# Patient Record
Sex: Female | Born: 1980
Health system: Southern US, Community
[De-identification: ages and names within clinical notes are randomized; demographics above are authoritative.]

## PROBLEM LIST (undated history)

## (undated) DIAGNOSIS — T7840XA Allergy, unspecified, initial encounter: Secondary | ICD-10-CM

## (undated) DIAGNOSIS — I7300 Raynaud's syndrome without gangrene: Secondary | ICD-10-CM

## (undated) DIAGNOSIS — U071 COVID-19: Secondary | ICD-10-CM

## (undated) DIAGNOSIS — Z9289 Personal history of other medical treatment: Secondary | ICD-10-CM

## (undated) DIAGNOSIS — Z8759 Personal history of other complications of pregnancy, childbirth and the puerperium: Secondary | ICD-10-CM

## (undated) DIAGNOSIS — K219 Gastro-esophageal reflux disease without esophagitis: Secondary | ICD-10-CM

## (undated) DIAGNOSIS — Z98891 History of uterine scar from previous surgery: Secondary | ICD-10-CM

## (undated) HISTORY — DX: Raynaud's syndrome without gangrene: I73.00

## (undated) HISTORY — PX: COSMETIC SURGERY: SHX468

## (undated) HISTORY — DX: COVID-19: U07.1

## (undated) HISTORY — DX: Allergy, unspecified, initial encounter: T78.40XA

---
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
## 2010-07-16 ENCOUNTER — Inpatient Hospital Stay (HOSPITAL_COMMUNITY)
Admission: AD | Admit: 2010-07-16 | Discharge: 2010-07-16 | Payer: Self-pay | Source: Home / Self Care | Admitting: Obstetrics

## 2010-07-23 ENCOUNTER — Ambulatory Visit (HOSPITAL_COMMUNITY)
Admission: RE | Admit: 2010-07-23 | Discharge: 2010-07-23 | Payer: Self-pay | Source: Home / Self Care | Admitting: Obstetrics

## 2010-08-24 DIAGNOSIS — Z9289 Personal history of other medical treatment: Secondary | ICD-10-CM

## 2010-08-24 HISTORY — DX: Personal history of other medical treatment: Z92.89

## 2010-09-15 ENCOUNTER — Observation Stay (HOSPITAL_COMMUNITY)
Admission: AD | Admit: 2010-09-15 | Discharge: 2010-09-16 | Payer: Self-pay | Source: Home / Self Care | Attending: Obstetrics & Gynecology | Admitting: Obstetrics & Gynecology

## 2010-09-16 LAB — CBC
Hemoglobin: 10.5 g/dL — ABNORMAL LOW (ref 12.0–15.0)
MCH: 32.2 pg (ref 26.0–34.0)
MCHC: 34.1 g/dL (ref 30.0–36.0)
RDW: 13.4 % (ref 11.5–15.5)

## 2010-09-16 LAB — TYPE AND SCREEN
ABO/RH(D): A POS
Antibody Screen: NEGATIVE

## 2010-09-17 LAB — GLUCOSE TOLERANCE, 1 HOUR: Glucose, 1 Hour GTT: 147 mg/dL — ABNORMAL HIGH (ref 70–140)

## 2010-09-19 NOTE — H&P (Signed)
  NAME:  Kendra Molina, Kendra Molina                ACCOUNT NO.:  [id number]  MEDICAL RECORD NO.:  [id number]          PATIENT TYPE:  OBV  LOCATION:  9156                          FACILITY:  [facility]  PHYSICIAN:  Lenoard Aden, M.D.DATE OF BIRTH:  1980/10/13  DATE OF ADMISSION:  09/15/2010 DATE OF DISCHARGE:                             HISTORY & PHYSICAL   CHIEF COMPLAINTS:  Pelvic pressure.  She is a 29-year [place] female G1, P0 with uncomplicated pregnancy care to date, who presented to the office today with abdominal discomfort, cramps, and lower abdominal pressure.  She had a speculum exam which revealed the cervix to be long, closed, and no digital exam due to history of placenta previa.  She had a fetal fibronectin performed that was positive and a previous GBS positive in her urine, for which she was started on Keflex and admitted at this time for continuous monitoring, betamethasone, NICU consultation, and rule out preterm labor.  She is at 38 and 4 days' gestation.  She has no known drug allergies.  No latex allergies.  MEDICATIONS:  Prenatal vitamins, calcium, iron, and Keflex 500 mg b.i.d. started today.  She is a nonsmoker, nondrinker.  She denies domestic or physical violence.  She has a family history of kidney stones, hypertension, rheumatoid arthritis, Down syndrome, mental retardation, heart disease, COPD, and diabetes.  She has a personal history that is otherwise noncontributory minus wisdom tooth extraction and removal of a birthmark on her right cheek.  PHYSICAL EXAMINATION:  VITAL SIGNS:  She has a height of 62 inches, a weight of 142.  Blood pressure 110/78. HEENT:  Normal. NECK:  Supple.  Full range of motion. LUNGS:  Clear. HEART:  Regular rhythm. ABDOMEN:  Soft, gravid, nontender.  No CVA tenderness. EXTREMITIES:  There were no cords. NEUROLOGIC:  Nonfocal. SKIN:  Intact. PELVIC:  Deferred due to previous history of placenta previa.  Fetal heart rate tracing  is reassuring with accelerations noted and no regular contractions noted.  IMPRESSION: 1. A 27-4/7th weeks' gestation. 2. Preterm contractions like symptoms.  No evidence of cervical change     with a positive FFN. 3. History of anterior partial placenta previa. 4. History of bilateral choroid plexus cysts with no other risk     factors for aneuploidy.  PLAN:  To be admitted for continuous monitoring at this point, continue her Keflex 500 mg p.o. b.i.d., administer betamethasone 12.5 mg IM, repeat in 24 hours.  We will obtain an ultrasound to follow up on her placenta previa in addition to her choroid plexus cysts.     Lenoard Aden, M.D.     RJT/MEDQ  D:  09/15/2010  T:  09/16/2010  Job:  [id number]  Electronically Signed by Olivia Mackie M.D. on 09/19/2010 02:35:35 PM

## 2010-10-17 NOTE — Discharge Summary (Signed)
  NAME:  Kendra Molina, Kendra Molina                ACCOUNT NO.:  [id number]  MEDICAL RECORD NO.:  [id number]          PATIENT TYPE:  LOCATION:                                 FACILITY:  PHYSICIAN:  Lenoard Aden, M.D.     DATE OF BIRTH:  DATE OF ADMISSION:  09/15/2010 DATE OF DISCHARGE:  09/16/2010                              DISCHARGE SUMMARY   ADMISSION DIAGNOSIS:  Preterm labor with positive fetal fibronectin.  DISCHARGE DIAGNOSIS:  Preterm labor with positive fetal fibronectin.  The patient was admitted due to a positive fetal fibronectin was obtained in the office on the day of admission.  Her hospital course was uncomplicated.  She had reassuring continuous monitoring.  She received betamethasone series, Keflex for her GBS bacteria.  She had a maternal fetal ultrasound which revealed a normal estimated fetal weight and an otherwise normal scan within a normal cervical length.  She was discharged to home on hospital day #2 after receiving her second of her betamethasone series.  She is scheduled for followup in the office in 1 week.  Discharge teaching was done.  Discharge medications are Keflex and prenatal vitamins.  The patient as noted to follow up in the office 1 week.     Lenoard Aden, M.D.     RJT/MEDQ  D:  09/30/2010  T:  10/01/2010  Job:  [id number]  Electronically Signed by Olivia Mackie M.D. on 10/17/2010 11:05:28 AM

## 2010-11-03 ENCOUNTER — Other Ambulatory Visit: Payer: Self-pay | Admitting: Obstetrics and Gynecology

## 2010-11-03 ENCOUNTER — Inpatient Hospital Stay (HOSPITAL_COMMUNITY)
Admission: AD | Admit: 2010-11-03 | Discharge: 2010-11-06 | DRG: 650 | Disposition: A | Discharge status: Home / Self Care | Payer: Federal, State, Local not specified - PPO | Source: Ambulatory Visit | Attending: Obstetrics and Gynecology | Admitting: Obstetrics and Gynecology

## 2010-11-03 DIAGNOSIS — O9903 Anemia complicating the puerperium: Secondary | ICD-10-CM | POA: Diagnosis not present

## 2010-11-03 DIAGNOSIS — Z22330 Carrier of Group B streptococcus: Secondary | ICD-10-CM

## 2010-11-03 DIAGNOSIS — O4410 Placenta previa with hemorrhage, unspecified trimester: Principal | ICD-10-CM | POA: Diagnosis present

## 2010-11-03 DIAGNOSIS — O99892 Other specified diseases and conditions complicating childbirth: Secondary | ICD-10-CM | POA: Diagnosis present

## 2010-11-03 DIAGNOSIS — D62 Acute posthemorrhagic anemia: Secondary | ICD-10-CM | POA: Diagnosis not present

## 2010-11-03 LAB — APTT: aPTT: 26 seconds (ref 24–37)

## 2010-11-03 LAB — CBC
HCT: 32.1 % — ABNORMAL LOW (ref 36.0–46.0)
Hemoglobin: 10.9 g/dL — ABNORMAL LOW (ref 12.0–15.0)
MCHC: 34 g/dL (ref 30.0–36.0)
MCV: 95.8 fL (ref 78.0–100.0)
RDW: 13.1 % (ref 11.5–15.5)
WBC: 14.4 10*3/uL — ABNORMAL HIGH (ref 4.0–10.5)

## 2010-11-03 LAB — COMPREHENSIVE METABOLIC PANEL
ALT: 20 U/L (ref 0–35)
AST: 28 U/L (ref 0–37)
Albumin: 2.9 g/dL — ABNORMAL LOW (ref 3.5–5.2)
Alkaline Phosphatase: 112 U/L (ref 39–117)
Chloride: 105 mEq/L (ref 96–112)
Potassium: 3.6 mEq/L (ref 3.5–5.1)
Sodium: 135 mEq/L (ref 135–145)
Total Bilirubin: 0.5 mg/dL (ref 0.3–1.2)
Total Protein: 6 g/dL (ref 6.0–8.3)

## 2010-11-03 LAB — PROTIME-INR: INR: 1.01 (ref 0.00–1.49)

## 2010-11-04 LAB — CBC
Hemoglobin: 7 g/dL — ABNORMAL LOW (ref 12.0–15.0)
MCH: 33 pg (ref 26.0–34.0)
MCHC: 34.3 g/dL (ref 30.0–36.0)
RDW: 13.1 % (ref 11.5–15.5)

## 2010-11-05 LAB — CBC
MCV: 98.1 fL (ref 78.0–100.0)
Platelets: 113 10*3/uL — ABNORMAL LOW (ref 150–400)
RDW: 13.6 % (ref 11.5–15.5)
WBC: 14.1 10*3/uL — ABNORMAL HIGH (ref 4.0–10.5)

## 2010-11-05 LAB — PREPARE RBC (CROSSMATCH)

## 2010-11-06 LAB — TYPE AND SCREEN
Unit division: 0
Unit division: 0

## 2010-11-06 LAB — CBC
MCH: 31.7 pg (ref 26.0–34.0)
MCHC: 34.1 g/dL (ref 30.0–36.0)
Platelets: 122 10*3/uL — ABNORMAL LOW (ref 150–400)
RBC: 3.09 MIL/uL — ABNORMAL LOW (ref 3.87–5.11)

## 2010-11-13 NOTE — Op Note (Signed)
NAME:  Kendra Molina, Kendra Molina                ACCOUNT NO.:  [id number]  MEDICAL RECORD NO.:  [id number]           PATIENT TYPE:  I  LOCATION:  9317                          [facility]ILITY:  [facility]  PHYSICIAN:  Lenoard Aden, M.D.DATE OF BIRTH:  Sep 20, 1980  DATE OF PROCEDURE:  11/03/2010 DATE OF DISCHARGE:                              OPERATIVE REPORT   PREOPERATIVE DIAGNOSES: 1. A 34-1/2 week intrauterine pregnancy. 2. Complete placenta previa with active bleeding.  POSTOPERATIVE DIAGNOSES: 1. A 34-1/2 week intrauterine pregnancy. 2. Complete placenta previa with active bleeding.  PROCEDURE:  Urgent primary low segment transverse cesarean section.  SURGEON:  Lenoard Aden, MD  ASSISTANT:  None.  ANESTHESIA:  Spinal by Quillian Quince, MD  ESTIMATED BLOOD LOSS:  1000 mL.  COMPLICATIONS:  None.  DRAINS:  Foley.  COUNTS:  Correct.  The patient to recovery in good condition.  FINDINGS:  A 5 pounds 7 ounces female, Apgars 8 and 9, pediatricians attendance.  Normal tubes, normal ovaries.  Bleeding from the left lateral edge of the incision controlled with an Publishing copy. Clear urine.  The patient to recovery in good condition.  All counts correct.  BRIEF OPERATIVE NOTE:  After being apprised of the risks of anesthesia, infection, bleeding, injury to the abdominal organs, need for repair, delayed versus immediate complications to include bowel and bladder injury, possible need for repair, the patient was brought to the operating where she was administered a spinal anesthetic without complications, prepped and draped in usual sterile fashion.  Foley catheter was placed.  After achieving adequate anesthesia, dilute Marcaine solution was placed.  A Pfannenstiel skin incision was made with scalpel, carried down to the fascia which was nicked in the midline and opened transversely using Mayo scissors.  Rectus muscles were dissected sharply in the midline, peritoneum entered  sharply.  Bladder blade placed.  Visceral peritoneum scored sharply off the lower uterine segment.  Kerr hysterotomy incision was made.  Atraumatic delivery after encountering a complete anterior placenta previa and then amniotomy of clear fluid for delivery of a female from an occiput transverse position with a loose nuchal cord x2, handed to the pediatrician in attendance. Apgars as noted.  Cord blood collected.  Placenta delivered manually intact.  Uterus exteriorized, curetted using dry lap pack and visualization reveals good hemostasis in the lower uterine segment.  In addition, a well contracted uterus which was then closed in two running imbricating layers of 0 Monocryl suture.  There was extension into the right uterine artery which was controlled using an Catering manager. After replacement of the uterus and the abdominal cavity and irrigation accomplished, further oozing was noted from this right lateral edge, an additional O'Leary stitch was placed.  After re-exteriorizing the uterus and placement, good hemostasis was assured and inspection revealed good hemostasis at this right lateral edge.  At this time, bladder flap was inspected and found to be hemostatic.  Good hemostasis was noted.  Urine output was excellent and urine is clear.  The parietal peritoneum was then closed using a 2-0 Vicryl suture in a pursestring fashion, the fascia closed using  0 Monocryl in a running fashion, skin closed using staples.  The patient tolerated this procedure well and was transferred to recovery in good condition.     Lenoard Aden, M.D.     RJT/MEDQ  D:  11/03/2010  T:  11/04/2010  Job:  [id number]  Electronically Signed by Olivia Mackie M.D. on 11/13/2010 01:33:22 PM

## 2010-11-13 NOTE — Discharge Summary (Signed)
NAME:  Kendra Molina, Kendra Molina                ACCOUNT NO.:  [id number]  MEDICAL RECORD NO.:  [id number]           PATIENT TYPE:  I  LOCATION:  9317                          [facility]ILITY:  [facility]  PHYSICIAN:  Lenoard Aden, M.D.DATE OF BIRTH:  [birth year]  DATE OF ADMISSION:  11/03/2010 DATE OF DISCHARGE:  11/06/2010                              DISCHARGE SUMMARY   ADMITTING DIAGNOSIS:  Intrauterine pregnancy at 34 weeks, placenta previa with active vaginal bleeding  DISCHARGE DIAGNOSIS:  Postop day #3 status post urgent primary C-section  for placenta previa with active bleeding, ABL anemia status post blood transfusion.  HISTORY:  The patient is gravida 1, para 0 at 32 weeks' and 5 days' gestation with [facility] December 11, 2010.  Prenatal care was obtained at [facility] since 8 weeks and 4 days' gestation with Dr. Ernestina Penna as primary.  PRENATAL LABS:  A+, rubella equivocal, GBS positive, HIV negative, RPR negative, hepatitis B negative, and 1-hour GTT within normal limits.  PRENATAL COURSE: Prenatal course complicated by placenta previa, anemia, and  threatened preterm labor at 27 wks with positive fetal fibronectin, at which  time patient was admited to antepartum unit for 24 hours monitoring and  betamethasone prophylaxis.    SURGICAL HISTORY:  The patient is healthy and has a known history of birthmark removal and wisdom teeth removal.  ALLERGIES:  No known drug allergies.  CURRENT MEDICATIONS:  Prenatal vitamins, iron, folic acid, Colace, vitamin D, and calcium.  PATIENT PRESENTATION:  The patient presents to [facility]U on the evening of November 03, 2010, with active pain with painless bleeding, reassuring fetal heart rate, and no contractions.  Upon speculum exam, clot was noted in vagina and active bleeding noted.  Admission labs were WBCs 14.4, hemoglobin 10.9, hematocrit 32.1, platelets 140.  PT 13.5, PTT 26, INR 1.01.  The patient was prepped for urgent C-section delivery.    SURGERY: The patient  was delivered via primary C-section on November 03, 2010, by Dr. Billy Coast. Indication was 34 weeks' intrauterine pregnancy with  active bleed and known placenta previa.  The patient was delivered of a female  infant with a weight of 5 pounds and 14 ounces and Apgars 9 and 9.  Newborn was transferred to the NICU.  POSTOP COURSE:  Postop course was complicated by severe ABL anemia and the patient underwent transfusion on postoperative day 2.  Postop labs were WBC 17.8, hemoglobin 7.0, hematocrit 20.4, and platelets 108. Repeat CBC on day 2 showed a drop in hemoglobin 6.7 and hematocrit 20.3. Subsequently the patient was transfused 3 PRBCs with subsequent increase in hemoglobin and hematocrit to 9.8 and 28.7, and platelets of 122 on postop  day #3. Vital signs remained stable.  The patient was afebrile throughout hospital stay and physical exam was within normal limits.  Wound was well approximated with staples.  No erythema, no drainage, and no ecchymosis at the staple site.  The patient is pumping breast milk and infant is undergoing feeds currently in NICU.  The patient was discharged home on postop day #3 in stable condition. Staples removed prior to discharge and replaced with Steri-Strips.  DIET:  Regular diet.  ACTIVITY:  Ad lib with postop weight lifting restrictions x2 weeks.  INSTRUCTIONS:  Per [facility] booklet.  MEDICATIONS: 1. MMR vaccine one dose prior to discharge. 2. Prenatal vitamins 1 tablet p.o. daily. 3. Colace 100 mg p.o. daily. 4. Ibuprofen 800 mg q.8 h. p.r.n. for pain. 5. Tylox 1-2 tablets p.o. q.4-6 h. p.r.n. for pain. 6. Nu-Iron 150 one tablet p.o. b.i.d.  Followup is in 6 weeks postpartum at [facility].    ______________________________ Arlan Organ, CNM   ______________________________ Lenoard Aden, M.D.    DP/MEDQ  D:  11/06/2010  T:  11/07/2010  Job:  [id number]  Electronically Signed by Arlan Organ CNM on 11/07/2010 06:42:43 AM Electronically Signed  by Olivia Mackie M.D. on 11/13/2010 01:33:29 PM

## 2010-12-03 ENCOUNTER — Ambulatory Visit (HOSPITAL_COMMUNITY)
Admission: RE | Admit: 2010-12-03 | Discharge: 2010-12-03 | Disposition: A | Discharge status: Home / Self Care | Payer: Federal, State, Local not specified - PPO | Source: Ambulatory Visit | Attending: Obstetrics | Admitting: Obstetrics

## 2010-12-17 ENCOUNTER — Other Ambulatory Visit: Payer: Self-pay | Admitting: Obstetrics

## 2011-03-11 ENCOUNTER — Encounter: Payer: Self-pay | Admitting: *Deleted

## 2012-12-06 ENCOUNTER — Other Ambulatory Visit (HOSPITAL_COMMUNITY): Payer: Self-pay | Admitting: Obstetrics

## 2012-12-06 DIAGNOSIS — N979 Female infertility, unspecified: Secondary | ICD-10-CM

## 2012-12-09 ENCOUNTER — Ambulatory Visit (HOSPITAL_COMMUNITY)
Admission: RE | Admit: 2012-12-09 | Discharge: 2012-12-09 | Disposition: A | Discharge status: Home / Self Care | Payer: Federal, State, Local not specified - PPO | Source: Ambulatory Visit | Attending: Obstetrics | Admitting: Obstetrics

## 2012-12-09 DIAGNOSIS — N979 Female infertility, unspecified: Secondary | ICD-10-CM

## 2012-12-09 MED ORDER — IOHEXOL 300 MG/ML  SOLN
20.0000 mL | Freq: Once | INTRAMUSCULAR | Status: AC | PRN
  Administered 2012-12-09: 3 mL

## 2013-05-04 LAB — OB RESULTS CONSOLE HIV ANTIBODY (ROUTINE TESTING): HIV: NONREACTIVE

## 2013-05-04 LAB — OB RESULTS CONSOLE ANTIBODY SCREEN: ANTIBODY SCREEN: NEGATIVE

## 2013-05-04 LAB — OB RESULTS CONSOLE HEPATITIS B SURFACE ANTIGEN: Hepatitis B Surface Ag: NEGATIVE

## 2013-05-04 LAB — OB RESULTS CONSOLE RUBELLA ANTIBODY, IGM: Rubella: IMMUNE

## 2013-05-04 LAB — OB RESULTS CONSOLE ABO/RH: "RH Type ": POSITIVE

## 2013-09-15 ENCOUNTER — Ambulatory Visit (INDEPENDENT_AMBULATORY_CARE_PROVIDER_SITE_OTHER): Payer: Federal, State, Local not specified - PPO | Admitting: Medical

## 2013-09-15 ENCOUNTER — Encounter: Payer: Self-pay | Admitting: Medical

## 2013-09-15 VITALS — BP 90/60 | HR 82 | Temp 98.2°F | Resp 18 | Wt 139.0 lb

## 2013-09-15 DIAGNOSIS — Z331 Pregnant state, incidental: Secondary | ICD-10-CM

## 2013-09-15 DIAGNOSIS — R040 Epistaxis: Secondary | ICD-10-CM

## 2013-09-15 DIAGNOSIS — J029 Acute pharyngitis, unspecified: Secondary | ICD-10-CM

## 2013-09-15 DIAGNOSIS — R0982 Postnasal drip: Secondary | ICD-10-CM

## 2013-09-15 LAB — POCT RAPID STREP A (OFFICE): RAPID STREP A SCREEN: NEGATIVE

## 2013-09-15 NOTE — Patient Instructions (Signed)
We will send culture of the throat.   Try changing to benadryl at night instead of Claritin for a week or so.  Use nasal saline flush followed by salt water gargles  Use warm fluids like decaf coffee or tea  You can use Tylenol for pain  Try using Vaseline coating in each nostril at bedtime  Drink plenty of water  Try lying more inclined in the bed until this resolves.

## 2013-09-15 NOTE — Progress Notes (Signed)
Subjective:  Kendra Molina is a 33 y.o. female who presents as a new patient today for evaluation of sore throat. She is [redacted] weeks pregnant, sees Dr. Ernestina PennaFogleman for gynecology/obstetrics.  She notes about 2 weeks history of sore throat, some ear discomfort on the left, congestion, postnasal drainage, some cough. Denies fever, nausea, vomiting, diarrhea, rash, no sinus pressure, teeth pain, no shortness of breath. No sick contacts with similar, no strep or mono exposures.  Treatment to date: Claritin.  They have gas heat at home and she has experienced nosebleeds. No other aggravating or relieving factors.  No other c/o.  The following portions of the patient's history were reviewed and updated as appropriate: allergies, current medications, past medical history, past social history, past surgical history and problem list.  ROS as in subjective   Objective: Filed Vitals:   09/15/13 1505  BP: 90/60  Pulse: 82  Temp: 98.2 F (36.8 C)  Resp: 18    General appearance: no distress, WD/WN HEENT: normocephalic, conjunctiva/corneas normal, sclerae anicteric, nares patent, clear and dry blood discharge , pharynx with mild erythema, no exudate.  Oral cavity: MMM, no lesions  Neck: supple, no lymphadenopathy, no thyromegaly Heart: RRR, normal S1, S2, no murmurs Lungs: CTA bilaterally, no wheezes, rhonchi, or rales   Laboratory Strep test done. Results:negative.    Assessment and Plan: Encounter Diagnoses  Name Primary?  . Sore throat Yes  . Post-nasal drip   . Epistaxis   . Pregnant state, incidental    Advised that her sore throat symptoms seem related to postnasal drainage and not an infectious etiology.  Strep negative, but we'll send a throat culture just to make sure.  Reassured, advise that her symptoms should resolve with some of the following recommendations  Patient Instructions  We will send culture of the throat.   Try changing to benadryl at night instead of Claritin for a  week or so.  Use nasal saline flush followed by salt water gargles  Use warm fluids like decaf coffee or tea  You can use Tylenol for pain  Try using Vaseline coating in each nostril at bedtime  Drink plenty of water  Try lying more inclined in the bed until this resolves.

## 2013-09-17 LAB — CULTURE, GROUP A STREP: Organism ID, Bacteria: NORMAL

## 2013-09-19 ENCOUNTER — Other Ambulatory Visit: Payer: Self-pay | Admitting: Medical

## 2013-09-19 MED ORDER — AMOXICILLIN 875 MG PO TABS: 875.0000 mg | ORAL_TABLET | Freq: Two times a day (BID) | ORAL | Status: DC

## 2013-09-22 LAB — OB RESULTS CONSOLE RPR: RPR: NONREACTIVE

## 2013-10-24 ENCOUNTER — Other Ambulatory Visit: Payer: Self-pay | Admitting: Obstetrics

## 2013-10-25 IMAGING — RF DG HYSTEROGRAM
5 series · 5 of 5 positions shown · IV contrast (omnipaque)
Comparison: none

CLINICAL DATA: Secondary infertility.  Assess tubal patency

HYSTEROSALPINGOGRAM
TECHNIQUE: Following cleansing of the cervix and vagina with
Betadine solution, a hysterosalpingogram was performed using a 5-
French hysterosalpingogram catheter and Omnipaque 300 contrast.
The patient tolerated the examination without difficulty.
Fluoroscopy time: 0.4 minutes.

[Series 1: run · 1 of 1 slices shown (1 of 5)]
[im 1/1]
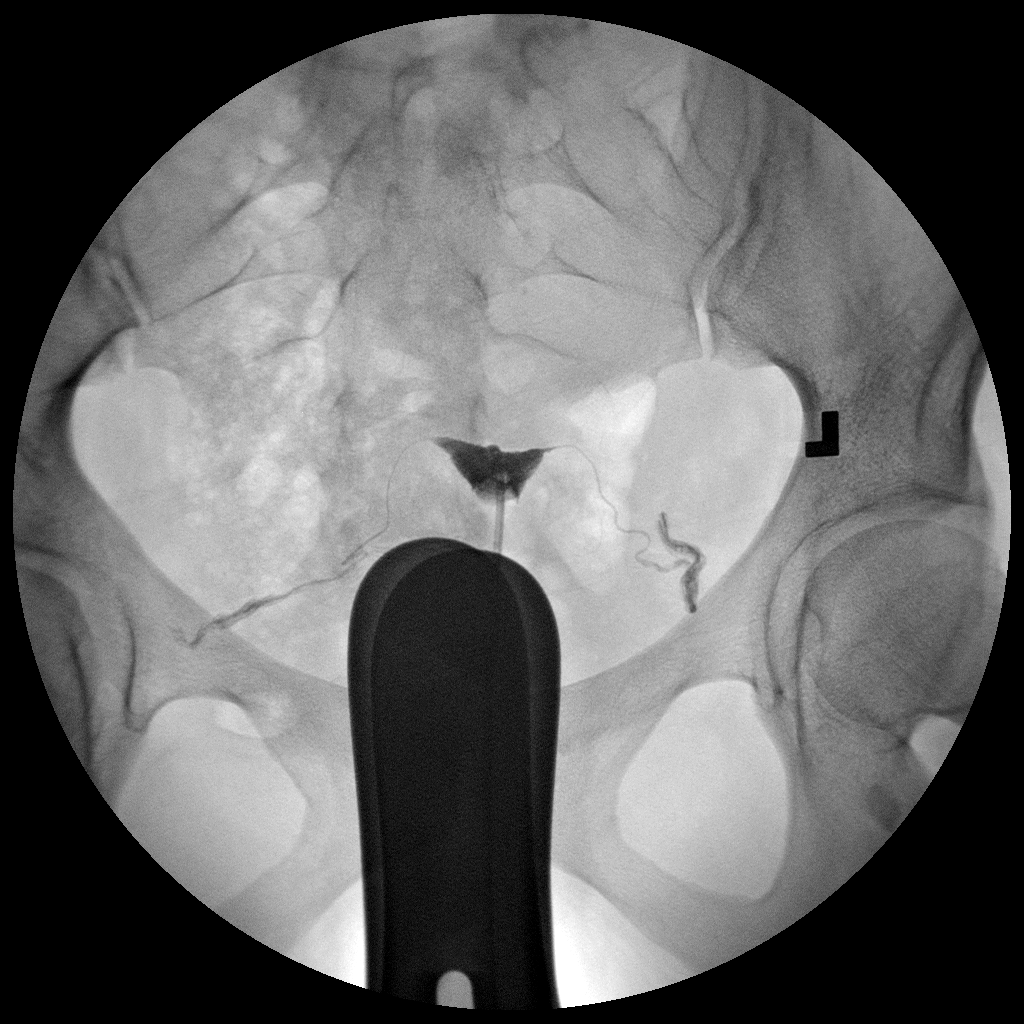

[Series 2: run · 1 of 1 slices shown (2 of 5)]
[im 1/1]
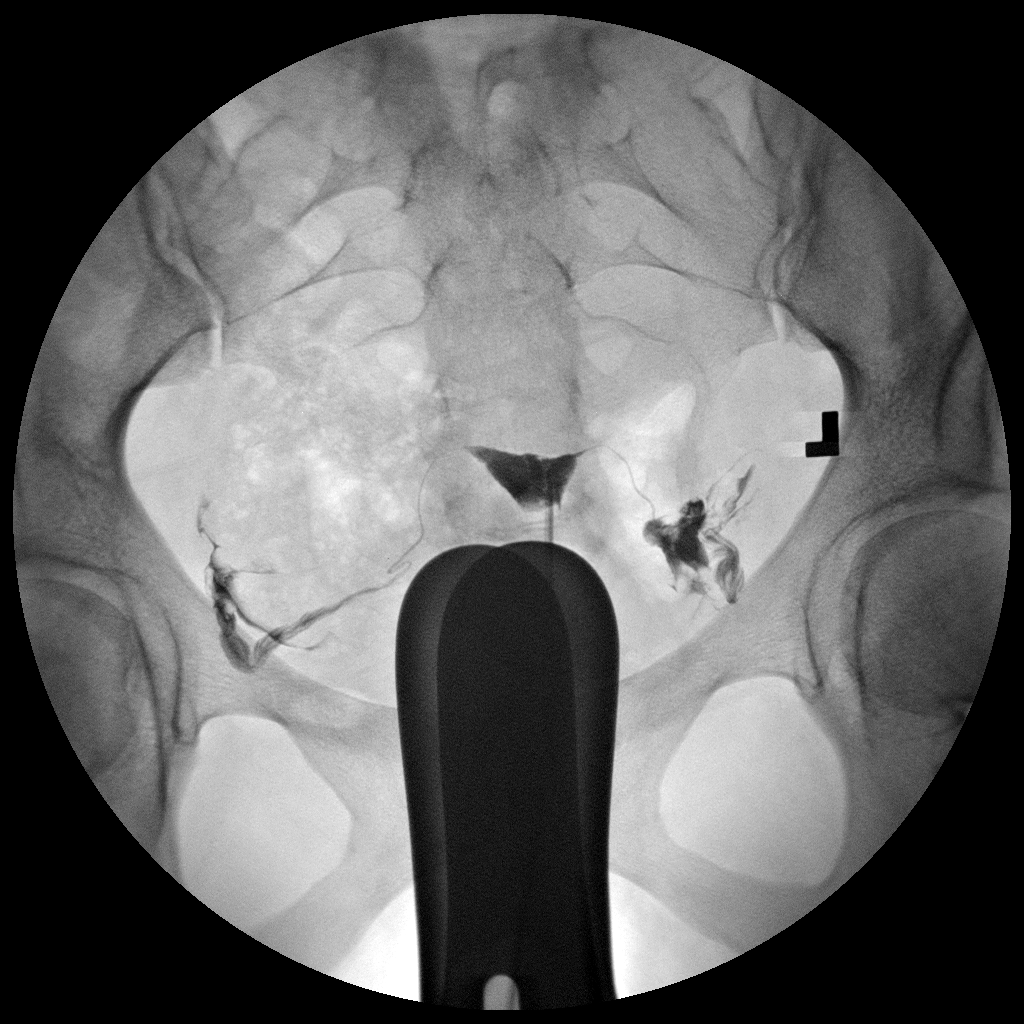

[Series 3: run · 1 of 1 slices shown (3 of 5)]
[im 1/1]
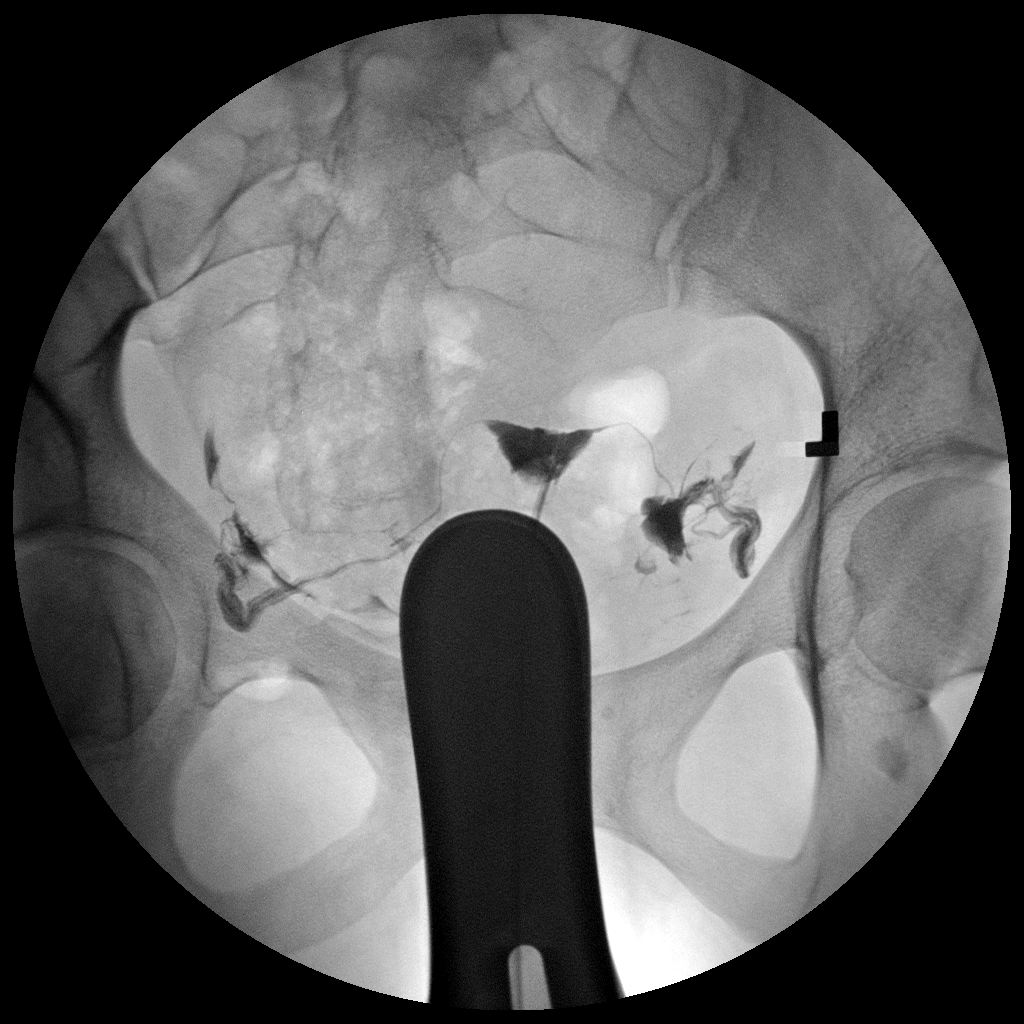

[Series 4: run · 1 of 1 slices shown (4 of 5)]
[im 1/1]
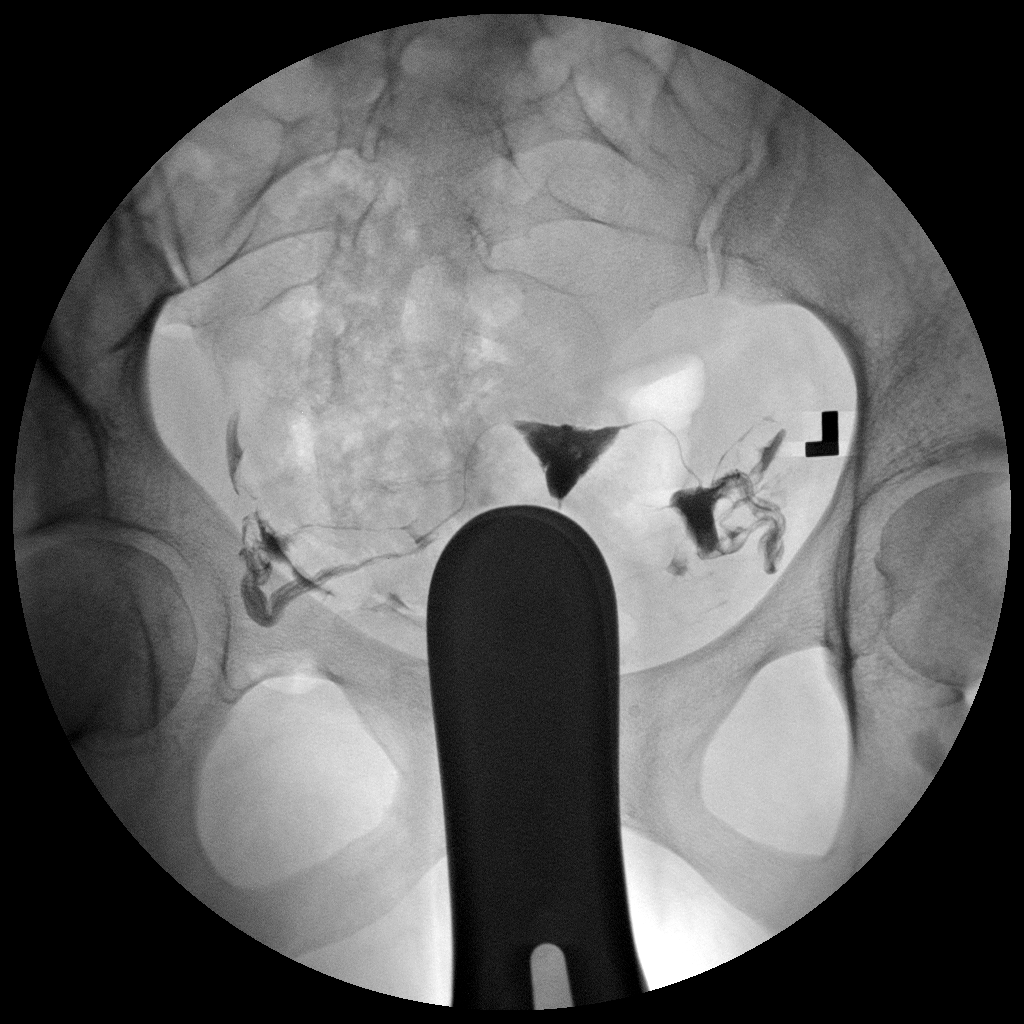

[Series 5: run · 1 of 1 slices shown (5 of 5)]
[im 1/1]
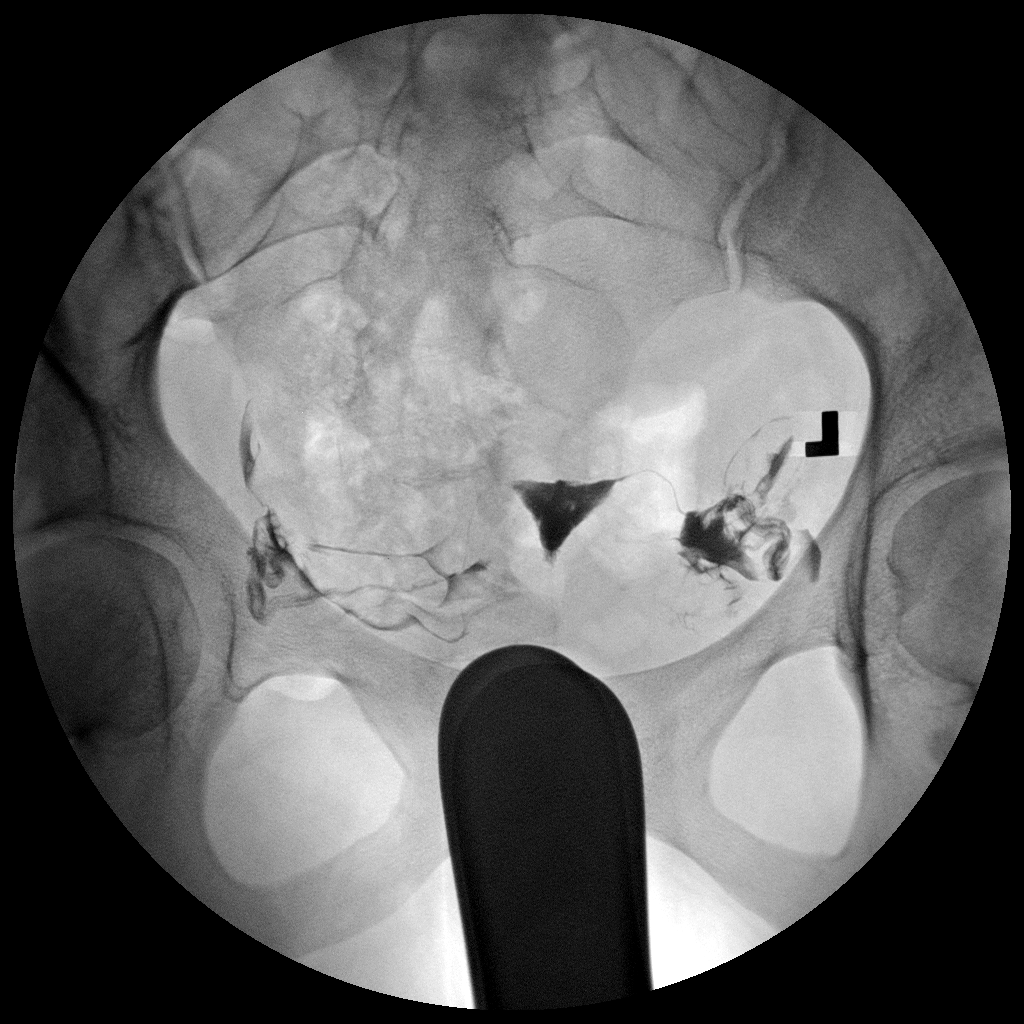

[5 of 5 positions shown; findings below may reference images not displayed]

FINDINGS: A normal endometrial morphology is seen.  Both fallopian
tubes have a normal morphology and bilateral free intraperitoneal
spill is noted.  No evidence for loculation contrast is seen to
suggest the presence of peritubal or periovarian adhesions.
IMPRESSION: Normal HSG

## 2013-11-01 ENCOUNTER — Telehealth: Payer: Self-pay | Admitting: Family Medicine

## 2013-11-01 NOTE — Telephone Encounter (Signed)
Dr Lynelle Doctorknapp has agreed to be this patient PCP. Pt is pregnant now & [facility]

## 2013-12-01 ENCOUNTER — Encounter (HOSPITAL_COMMUNITY): Payer: Self-pay | Admitting: Pharmacist

## 2013-12-04 ENCOUNTER — Encounter (HOSPITAL_COMMUNITY): Payer: Self-pay

## 2013-12-05 ENCOUNTER — Encounter (HOSPITAL_COMMUNITY)
Admission: RE | Admit: 2013-12-05 | Discharge: 2013-12-05 | Disposition: A | Discharge status: Home / Self Care | Payer: Federal, State, Local not specified - PPO | Source: Ambulatory Visit | Attending: Obstetrics | Admitting: Obstetrics

## 2013-12-05 ENCOUNTER — Encounter (HOSPITAL_COMMUNITY): Payer: Self-pay

## 2013-12-05 HISTORY — DX: Personal history of other complications of pregnancy, childbirth and the puerperium: Z87.59

## 2013-12-05 HISTORY — DX: Personal history of other medical treatment: Z92.89

## 2013-12-05 HISTORY — DX: Gastro-esophageal reflux disease without esophagitis: K21.9

## 2013-12-05 LAB — CBC
HCT: 32.2 % — ABNORMAL LOW (ref 36.0–46.0)
Hemoglobin: 10.8 g/dL — ABNORMAL LOW (ref 12.0–15.0)
MCH: 31.9 pg (ref 26.0–34.0)
MCHC: 33.5 g/dL (ref 30.0–36.0)
MCV: 95 fL (ref 78.0–100.0)
Platelets: 101 10*3/uL — ABNORMAL LOW (ref 150–400)
RBC: 3.39 MIL/uL — ABNORMAL LOW (ref 3.87–5.11)
RDW: 13.1 % (ref 11.5–15.5)
WBC: 8 10*3/uL (ref 4.0–10.5)

## 2013-12-05 LAB — TYPE AND SCREEN
ABO/RH(D): A POS
Antibody Screen: NEGATIVE

## 2013-12-05 LAB — RPR

## 2013-12-05 NOTE — Patient Instructions (Signed)
Your procedure is scheduled on: Thursday, December 07, 2013  [facility] at: 9[facility]30 am  Pick up the phone at the desk and dial [phone].  Call this number if you have problems the morning of surgery: [phone].  Remember: Do NOT eat food: AFTER MIDNIGHT WEDNESDAY Do NOT drink clear liquids after: AFTER 7:00AM THURSDAY MORNING Take these medicines the morning of surgery with a SIP OF WATER:  ZANTAC  Do NOT wear jewelry (body piercing), metal hair clips/bobby pins, make-up, or nail polish. Do NOT wear lotions, powders, or perfumes.  You may wear deoderant. Do NOT shave for 48 hours prior to surgery. Do NOT bring valuables to the hospital. Contacts, dentures, or bridgework may not be worn into surgery. Leave suitcase in car.  After surgery it may be brought to your room.  For patients admitted to the hospital, checkout time is 11:00 AM the day of discharge.

## 2013-12-05 NOTE — Pre-Procedure Instructions (Signed)
Dr. Brayton CavesFreeman Jackson made aware of patient's low platelets of 101 repeat CBC day of surgery.

## 2013-12-07 ENCOUNTER — Encounter (HOSPITAL_COMMUNITY): Payer: Federal, State, Local not specified - PPO | Admitting: Registered Nurse

## 2013-12-07 ENCOUNTER — Encounter (HOSPITAL_COMMUNITY): Payer: Self-pay | Admitting: Registered Nurse

## 2013-12-07 ENCOUNTER — Inpatient Hospital Stay (HOSPITAL_COMMUNITY): Payer: Federal, State, Local not specified - PPO | Admitting: Registered Nurse

## 2013-12-07 ENCOUNTER — Inpatient Hospital Stay (HOSPITAL_COMMUNITY)
Admission: AD | Admit: 2013-12-07 | Discharge: 2013-12-09 | DRG: 765 | Disposition: A | Discharge status: Home / Self Care | Payer: Federal, State, Local not specified - PPO | Source: Ambulatory Visit | Attending: Obstetrics | Admitting: Obstetrics

## 2013-12-07 ENCOUNTER — Encounter (HOSPITAL_COMMUNITY)
Admission: AD | Disposition: A | Discharge status: Home / Self Care | Payer: Self-pay | Source: Ambulatory Visit | Attending: Obstetrics

## 2013-12-07 DIAGNOSIS — O9903 Anemia complicating the puerperium: Secondary | ICD-10-CM | POA: Diagnosis not present

## 2013-12-07 DIAGNOSIS — O34219 Maternal care for unspecified type scar from previous cesarean delivery: Principal | ICD-10-CM | POA: Diagnosis present

## 2013-12-07 DIAGNOSIS — O9912 Other diseases of the blood and blood-forming organs and certain disorders involving the immune mechanism complicating childbirth: Secondary | ICD-10-CM

## 2013-12-07 DIAGNOSIS — D62 Acute posthemorrhagic anemia: Secondary | ICD-10-CM | POA: Diagnosis not present

## 2013-12-07 DIAGNOSIS — Z833 Family history of diabetes mellitus: Secondary | ICD-10-CM

## 2013-12-07 DIAGNOSIS — Z8249 Family history of ischemic heart disease and other diseases of the circulatory system: Secondary | ICD-10-CM

## 2013-12-07 DIAGNOSIS — D689 Coagulation defect, unspecified: Secondary | ICD-10-CM | POA: Diagnosis present

## 2013-12-07 DIAGNOSIS — Z8349 Family history of other endocrine, nutritional and metabolic diseases: Secondary | ICD-10-CM

## 2013-12-07 DIAGNOSIS — I7300 Raynaud's syndrome without gangrene: Secondary | ICD-10-CM | POA: Diagnosis present

## 2013-12-07 DIAGNOSIS — K219 Gastro-esophageal reflux disease without esophagitis: Secondary | ICD-10-CM | POA: Diagnosis present

## 2013-12-07 DIAGNOSIS — Z98891 History of uterine scar from previous surgery: Secondary | ICD-10-CM

## 2013-12-07 DIAGNOSIS — D696 Thrombocytopenia, unspecified: Secondary | ICD-10-CM | POA: Diagnosis present

## 2013-12-07 HISTORY — DX: History of uterine scar from previous surgery: Z98.891

## 2013-12-07 LAB — CBC
HCT: 33.3 % — ABNORMAL LOW (ref 36.0–46.0)
Hemoglobin: 11.1 g/dL — ABNORMAL LOW (ref 12.0–15.0)
MCH: 31.6 pg (ref 26.0–34.0)
MCHC: 33.3 g/dL (ref 30.0–36.0)
MCV: 94.9 fL (ref 78.0–100.0)
PLATELETS: 106 10*3/uL — AB (ref 150–400)
RBC: 3.51 MIL/uL — ABNORMAL LOW (ref 3.87–5.11)
RDW: 13.3 % (ref 11.5–15.5)
WBC: 9 10*3/uL (ref 4.0–10.5)

## 2013-12-07 SURGERY — Surgical Case
Anesthesia: Spinal | Site: Abdomen

## 2013-12-07 MED ORDER — DIBUCAINE 1 % RE OINT: 1.0000 "application " | TOPICAL_OINTMENT | RECTAL | Status: DC | PRN

## 2013-12-07 MED ORDER — MORPHINE SULFATE 0.5 MG/ML IJ SOLN
INTRAMUSCULAR | Status: AC
  Filled 2013-12-07: qty 10

## 2013-12-07 MED ORDER — NALBUPHINE HCL 10 MG/ML IJ SOLN: 5.0000 mg | INTRAMUSCULAR | Status: DC | PRN

## 2013-12-07 MED ORDER — ACETAMINOPHEN 10 MG/ML IV SOLN
1000.0000 mg | Freq: Once | INTRAVENOUS | Status: AC
  Administered 2013-12-07: 1000 mg via INTRAVENOUS
  Filled 2013-12-07: qty 100

## 2013-12-07 MED ORDER — ONDANSETRON HCL 4 MG/2ML IJ SOLN: 4.0000 mg | INTRAMUSCULAR | Status: DC | PRN

## 2013-12-07 MED ORDER — FENTANYL CITRATE 0.05 MG/ML IJ SOLN
INTRAMUSCULAR | Status: DC | PRN
  Administered 2013-12-07: 25 ug via INTRATHECAL

## 2013-12-07 MED ORDER — PRENATAL MULTIVITAMIN CH
1.0000 | ORAL_TABLET | Freq: Every day | ORAL | Status: DC
  Administered 2013-12-07 – 2013-12-09 (×3): 1 via ORAL
  Filled 2013-12-07 (×3): qty 1

## 2013-12-07 MED ORDER — NALOXONE HCL 1 MG/ML IJ SOLN
1.0000 ug/kg/h | INTRAVENOUS | Status: DC | PRN
  Filled 2013-12-07: qty 2

## 2013-12-07 MED ORDER — OXYCODONE-ACETAMINOPHEN 5-325 MG PO TABS
1.0000 | ORAL_TABLET | ORAL | Status: DC | PRN
  Administered 2013-12-07 – 2013-12-08 (×3): 1 via ORAL
  Administered 2013-12-08 (×2): 2 via ORAL
  Administered 2013-12-08: 1 via ORAL
  Administered 2013-12-09: 2 via ORAL
  Administered 2013-12-09: 1 via ORAL
  Filled 2013-12-07 (×2): qty 1
  Filled 2013-12-07 (×2): qty 2
  Filled 2013-12-07 (×3): qty 1
  Filled 2013-12-07: qty 2

## 2013-12-07 MED ORDER — KETOROLAC TROMETHAMINE 30 MG/ML IJ SOLN: 30.0000 mg | Freq: Four times a day (QID) | INTRAMUSCULAR | Status: AC | PRN

## 2013-12-07 MED ORDER — DIPHENHYDRAMINE HCL 50 MG/ML IJ SOLN: 25.0000 mg | INTRAMUSCULAR | Status: DC | PRN

## 2013-12-07 MED ORDER — MORPHINE SULFATE (PF) 0.5 MG/ML IJ SOLN
INTRAMUSCULAR | Status: DC | PRN
  Administered 2013-12-07: .15 mg via INTRATHECAL

## 2013-12-07 MED ORDER — ONDANSETRON HCL 4 MG PO TABS: 4.0000 mg | ORAL_TABLET | ORAL | Status: DC | PRN

## 2013-12-07 MED ORDER — SIMETHICONE 80 MG PO CHEW
80.0000 mg | CHEWABLE_TABLET | ORAL | Status: DC
  Administered 2013-12-07 – 2013-12-08 (×2): 80 mg via ORAL
  Filled 2013-12-07 (×2): qty 1

## 2013-12-07 MED ORDER — ONDANSETRON HCL 4 MG/2ML IJ SOLN: 4.0000 mg | Freq: Three times a day (TID) | INTRAMUSCULAR | Status: DC | PRN

## 2013-12-07 MED ORDER — DIPHENHYDRAMINE HCL 50 MG/ML IJ SOLN
12.5000 mg | INTRAMUSCULAR | Status: DC | PRN
Start: 2013-12-07 — End: 2013-12-09

## 2013-12-07 MED ORDER — PHENYLEPHRINE 8 MG IN D5W 100 ML (0.08MG/ML) PREMIX OPTIME
INJECTION | INTRAVENOUS | Status: AC
  Filled 2013-12-07: qty 100

## 2013-12-07 MED ORDER — FENTANYL CITRATE 0.05 MG/ML IJ SOLN
25.0000 ug | INTRAMUSCULAR | Status: DC | PRN
  Administered 2013-12-07: 50 ug via INTRAVENOUS

## 2013-12-07 MED ORDER — DIPHENHYDRAMINE HCL 25 MG PO CAPS: 25.0000 mg | ORAL_CAPSULE | Freq: Four times a day (QID) | ORAL | Status: DC | PRN

## 2013-12-07 MED ORDER — OXYTOCIN 10 UNIT/ML IJ SOLN
40.0000 [IU] | INTRAVENOUS | Status: DC | PRN
  Administered 2013-12-07: 40 [IU] via INTRAVENOUS

## 2013-12-07 MED ORDER — METOCLOPRAMIDE HCL 5 MG/ML IJ SOLN: 10.0000 mg | Freq: Three times a day (TID) | INTRAMUSCULAR | Status: DC | PRN

## 2013-12-07 MED ORDER — SODIUM CHLORIDE 0.9 % IJ SOLN: 3.0000 mL | INTRAMUSCULAR | Status: DC | PRN

## 2013-12-07 MED ORDER — METOCLOPRAMIDE HCL 5 MG/ML IJ SOLN: 10.0000 mg | Freq: Once | INTRAMUSCULAR | Status: DC | PRN

## 2013-12-07 MED ORDER — CEFAZOLIN SODIUM-DEXTROSE 2-3 GM-% IV SOLR
2.0000 g | INTRAVENOUS | Status: AC
  Administered 2013-12-07: 2 g via INTRAVENOUS

## 2013-12-07 MED ORDER — SCOPOLAMINE 1 MG/3DAYS TD PT72
1.0000 | MEDICATED_PATCH | Freq: Once | TRANSDERMAL | Status: DC
  Filled 2013-12-07: qty 1

## 2013-12-07 MED ORDER — PHENYLEPHRINE 8 MG IN D5W 100 ML (0.08MG/ML) PREMIX OPTIME
INJECTION | INTRAVENOUS | Status: DC | PRN
  Administered 2013-12-07: 60 ug/min via INTRAVENOUS

## 2013-12-07 MED ORDER — FENTANYL CITRATE 0.05 MG/ML IJ SOLN
INTRAMUSCULAR | Status: AC
  Filled 2013-12-07: qty 2

## 2013-12-07 MED ORDER — FENTANYL CITRATE 0.05 MG/ML IJ SOLN
INTRAMUSCULAR | Status: AC
  Administered 2013-12-07: 50 ug via INTRAVENOUS
  Filled 2013-12-07: qty 2

## 2013-12-07 MED ORDER — BUPIVACAINE IN DEXTROSE 0.75-8.25 % IT SOLN
INTRATHECAL | Status: DC | PRN
  Administered 2013-12-07: 11 mg via INTRATHECAL

## 2013-12-07 MED ORDER — TETANUS-DIPHTH-ACELL PERTUSSIS 5-2.5-18.5 LF-MCG/0.5 IM SUSP: 0.5000 mL | Freq: Once | INTRAMUSCULAR | Status: DC

## 2013-12-07 MED ORDER — DIPHENHYDRAMINE HCL 50 MG/ML IJ SOLN
INTRAMUSCULAR | Status: AC
  Filled 2013-12-07: qty 1

## 2013-12-07 MED ORDER — MEPERIDINE HCL 25 MG/ML IJ SOLN: 6.2500 mg | INTRAMUSCULAR | Status: DC | PRN

## 2013-12-07 MED ORDER — ONDANSETRON HCL 4 MG/2ML IJ SOLN
INTRAMUSCULAR | Status: AC
  Filled 2013-12-07: qty 2

## 2013-12-07 MED ORDER — OXYTOCIN 10 UNIT/ML IJ SOLN
INTRAMUSCULAR | Status: AC
  Filled 2013-12-07: qty 4

## 2013-12-07 MED ORDER — SIMETHICONE 80 MG PO CHEW
80.0000 mg | CHEWABLE_TABLET | Freq: Three times a day (TID) | ORAL | Status: DC
  Administered 2013-12-07 – 2013-12-09 (×6): 80 mg via ORAL
  Filled 2013-12-07 (×6): qty 1

## 2013-12-07 MED ORDER — SCOPOLAMINE 1 MG/3DAYS TD PT72
1.0000 | MEDICATED_PATCH | Freq: Once | TRANSDERMAL | Status: DC
  Administered 2013-12-07: 1.5 mg via TRANSDERMAL

## 2013-12-07 MED ORDER — SIMETHICONE 80 MG PO CHEW: 80.0000 mg | CHEWABLE_TABLET | ORAL | Status: DC | PRN

## 2013-12-07 MED ORDER — MENTHOL 3 MG MT LOZG: 1.0000 | LOZENGE | OROMUCOSAL | Status: DC | PRN

## 2013-12-07 MED ORDER — IBUPROFEN 600 MG PO TABS: 600.0000 mg | ORAL_TABLET | Freq: Four times a day (QID) | ORAL | Status: DC

## 2013-12-07 MED ORDER — NALOXONE HCL 0.4 MG/ML IJ SOLN: 0.4000 mg | INTRAMUSCULAR | Status: DC | PRN

## 2013-12-07 MED ORDER — WITCH HAZEL-GLYCERIN EX PADS: 1.0000 "application " | MEDICATED_PAD | CUTANEOUS | Status: DC | PRN

## 2013-12-07 MED ORDER — ZOLPIDEM TARTRATE 5 MG PO TABS: 5.0000 mg | ORAL_TABLET | Freq: Every evening | ORAL | Status: DC | PRN

## 2013-12-07 MED ORDER — DIPHENHYDRAMINE HCL 50 MG/ML IJ SOLN
INTRAMUSCULAR | Status: DC | PRN
  Administered 2013-12-07 (×2): 25 mg via INTRAVENOUS

## 2013-12-07 MED ORDER — LACTATED RINGERS IV SOLN: INTRAVENOUS | Status: DC

## 2013-12-07 MED ORDER — LACTATED RINGERS IV SOLN
INTRAVENOUS | Status: DC
  Administered 2013-12-07 (×3): via INTRAVENOUS

## 2013-12-07 MED ORDER — OXYTOCIN 40 UNITS IN LACTATED RINGERS INFUSION - SIMPLE MED: 62.5000 mL/h | INTRAVENOUS | Status: AC

## 2013-12-07 MED ORDER — KETOROLAC TROMETHAMINE 60 MG/2ML IM SOLN
60.0000 mg | Freq: Once | INTRAMUSCULAR | Status: AC | PRN
  Filled 2013-12-07: qty 2

## 2013-12-07 MED ORDER — SENNOSIDES-DOCUSATE SODIUM 8.6-50 MG PO TABS
2.0000 | ORAL_TABLET | ORAL | Status: DC
  Administered 2013-12-07 – 2013-12-08 (×2): 2 via ORAL
  Filled 2013-12-07 (×2): qty 2

## 2013-12-07 MED ORDER — DIPHENHYDRAMINE HCL 25 MG PO CAPS: 25.0000 mg | ORAL_CAPSULE | ORAL | Status: DC | PRN

## 2013-12-07 MED ORDER — ONDANSETRON HCL 4 MG/2ML IJ SOLN
INTRAMUSCULAR | Status: DC | PRN
  Administered 2013-12-07: 4 mg via INTRAVENOUS

## 2013-12-07 MED ORDER — LANOLIN HYDROUS EX OINT: 1.0000 "application " | TOPICAL_OINTMENT | CUTANEOUS | Status: DC | PRN

## 2013-12-07 MED ORDER — LACTATED RINGERS IV SOLN
INTRAVENOUS | Status: DC
  Administered 2013-12-07: 16:00:00 via INTRAVENOUS

## 2013-12-07 SURGICAL SUPPLY — 33 items
BENZOIN TINCTURE PRP APPL 2/3 (GAUZE/BANDAGES/DRESSINGS) ×2 IMPLANT
CLAMP CORD UMBIL (MISCELLANEOUS) IMPLANT
CLOTH BEACON ORANGE TIMEOUT ST (SAFETY) ×2 IMPLANT
CONTAINER PREFILL 10% NBF 15ML (MISCELLANEOUS) IMPLANT
DRAPE LG THREE QUARTER DISP (DRAPES) IMPLANT
DRSG OPSITE POSTOP 4X10 (GAUZE/BANDAGES/DRESSINGS) ×2 IMPLANT
DURAPREP 26ML APPLICATOR (WOUND CARE) ×2 IMPLANT
ELECT REM PT RETURN 9FT ADLT (ELECTROSURGICAL) ×2
ELECTRODE REM PT RTRN 9FT ADLT (ELECTROSURGICAL) ×1 IMPLANT
EXTRACTOR VACUUM KIWI (MISCELLANEOUS) IMPLANT
EXTRACTOR VACUUM M CUP 4 TUBE (SUCTIONS) IMPLANT
GLOVE BIO SURGEON STRL SZ 6.5 (GLOVE) ×2 IMPLANT
GLOVE BIOGEL PI IND STRL 7.0 (GLOVE) ×1 IMPLANT
GLOVE BIOGEL PI INDICATOR 7.0 (GLOVE) ×1
GOWN STRL REUS W/TWL LRG LVL3 (GOWN DISPOSABLE) ×4 IMPLANT
KIT ABG SYR 3ML LUER SLIP (SYRINGE) IMPLANT
NEEDLE HYPO 25X5/8 SAFETYGLIDE (NEEDLE) IMPLANT
NS IRRIG 1000ML POUR BTL (IV SOLUTION) ×2 IMPLANT
PACK C SECTION WH (CUSTOM PROCEDURE TRAY) ×2 IMPLANT
PAD OB MATERNITY 4.3X12.25 (PERSONAL CARE ITEMS) ×2 IMPLANT
STAPLER VISISTAT 35W (STAPLE) IMPLANT
STRIP CLOSURE SKIN 1/2X4 (GAUZE/BANDAGES/DRESSINGS) ×2 IMPLANT
SUT MON AB 4-0 PS1 27 (SUTURE) ×2 IMPLANT
SUT PLAIN 0 NONE (SUTURE) IMPLANT
SUT PLAIN 2 0 XLH (SUTURE) ×2 IMPLANT
SUT VIC AB 0 CT1 36 (SUTURE) ×4 IMPLANT
SUT VIC AB 0 CTX 36 (SUTURE) ×3
SUT VIC AB 0 CTX36XBRD ANBCTRL (SUTURE) ×3 IMPLANT
SUT VIC AB 2-0 CT1 27 (SUTURE) ×1
SUT VIC AB 2-0 CT1 TAPERPNT 27 (SUTURE) ×1 IMPLANT
TOWEL OR 17X24 6PK STRL BLUE (TOWEL DISPOSABLE) ×2 IMPLANT
TRAY FOLEY CATH 14FR (SET/KITS/TRAYS/PACK) IMPLANT
WATER STERILE IRR 1000ML POUR (IV SOLUTION) IMPLANT

## 2013-12-07 NOTE — Lactation Note (Signed)
This note was copied from the chart of Girl Janeice RobinsonCarrie Puthoff. Lactation Consultation Note  Patient Name: Girl Janeice RobinsonCarrie Baize ZOXWR'UToday's Date: 12/07/2013 Reason for consult: Initial assessment Called by RN to see Mom in PACU to assist with latch. Mom's right nipple is flat, left nipple is flat, dimpled in center. Mom's 1st baby was preterm and in NICU, Mom reports her 1st baby never did latch well, she used a nipple shield for a short time but mostly pump and bottle fed. Mom has lots of colostrum present with hand expression. Dripped colostrum in baby's mouth and with full LC assist, baby latched in football hold. She sustained the latch for 15 minutes, Mom's nipple was slightly pink at the end of the feeding, but more erect. Baby did not latch to left nipple. Gave Mom hand pump to pre-pump to help with latch. Basic teaching reviewed. Advised Mom if baby not latching we can hand express and spoon feed, may initiate nipple shield if baby not latching. Lactation brochure left for review. Changed flange to size 27 for hand pump. Advised of OP services and support group. Advised to ask for assist with latching baby till it becomes easier. Will recommend evening LC follow up with Mom.   Maternal Data Formula Feeding for Exclusion: No Infant to breast within first hour of birth: No Breastfeeding delayed due to:: Maternal status Has patient been taught Hand Expression?: Yes Does the patient have breastfeeding experience prior to this delivery?: Yes  Feeding Feeding Type: Breast Fed Length of feed: 15 min  LATCH Score/Interventions Latch: Repeated attempts needed to sustain latch, nipple held in mouth throughout feeding, stimulation needed to elicit sucking reflex. Intervention(s): Adjust position;Assist with latch;Breast massage;Breast compression  Audible Swallowing: A few with stimulation Intervention(s): Skin to skin  Type of Nipple: Flat (left dimpled in center) Intervention(s): Hand pump  Comfort  (Breast/Nipple): Soft / non-tender     Hold (Positioning): Full assist, staff holds infant at breast Intervention(s): Breastfeeding basics reviewed;Support Pillows;Position options;Skin to skin  LATCH Score: 5  Lactation Tools Discussed/Used Tools: Pump;Flanges Flange Size: 27 Breast pump type: Manual   Consult Status Consult Status: Follow-up Date: 12/07/13 Follow-up type: In-patient    Kearney HardKathy Ann Hutton Pellicane 12/07/2013, 2:05 PM

## 2013-12-07 NOTE — Anesthesia Procedure Notes (Signed)
Spinal  Patient location during procedure: OR Start time: 12/07/2013 10:58 AM Staffing Anesthesiologist: Brayton CavesJACKSON, Dahmir Epperly Performed by: anesthesiologist  Preanesthetic Checklist Completed: patient identified, site marked, surgical consent, pre-op evaluation, timeout performed, IV checked, risks and benefits discussed and monitors and equipment checked Spinal Block Patient position: sitting Prep: DuraPrep Patient monitoring: heart rate, cardiac monitor, continuous pulse ox and blood pressure Approach: midline Location: L3-4 Injection technique: single-shot Needle Needle type: Sprotte  Needle gauge: 24 G Needle length: 9 cm Assessment Sensory level: T4 Additional Notes Patient identified.  Risk benefits discussed including failed block, incomplete pain control, headache, nerve damage, paralysis, blood pressure changes, nausea, vomiting, reactions to medication both toxic or allergic, and postpartum back pain.  Patient expressed understanding and wished to proceed.  All questions were answered.  Sterile technique used throughout procedure.  CSF was clear.  No parasthesia or other complications.  Please see nursing notes for vital signs.

## 2013-12-07 NOTE — Brief Op Note (Signed)
12/07/2013  12:16 PM  PATIENT:  Kendra Molina  33 y.o. female  PRE-OPERATIVE DIAGNOSIS:  Previous Cesarean Section  POST-OPERATIVE DIAGNOSIS:  Previous Cesarean Section  PROCEDURE:  Procedure(s) with comments: Repeat CESAREAN SECTION (N/A) - EDD: 12/14/13 with 2 layer closure  SURGEON:  Surgeon(s) and Role:    Tresa Endo* [name], MD - Primary  PHYSICIAN ASSISTANT:   ASSISTANTS: Arita Missawson, CNM   ANESTHESIA:   spinal  EBL:  Total I/O In: 2700 [I.V.:2700] Out: 900 [Urine:200; Blood:700]  BLOOD ADMINISTERED:none  DRAINS: Urinary Catheter (Foley)   LOCAL MEDICATIONS USED:  NONE  SPECIMEN:  Source of Specimen:  placenta  DISPOSITION OF SPECIMEN:  L&D  COUNTS:  YES  TOURNIQUET:  * No tourniquets in log *  DICTATION: .Note written in EPIC  PLAN OF CARE: Admit to inpatient   PATIENT DISPOSITION:  PACU - hemodynamically stable.   Delay start of Pharmacological VTE agent (>24hrs) due to surgical blood loss or risk of bleeding: yes

## 2013-12-07 NOTE — Anesthesia Postprocedure Evaluation (Signed)
Anesthesia Post Note  Patient: Kendra Molina  Procedure(s) Performed: Procedure(s) (LRB): Repeat CESAREAN SECTION (N/A)  Anesthesia type: Spinal  Patient location: PACU  Post pain: Pain level controlled  Post assessment: Post-op Vital signs reviewed  Last Vitals:  Filed Vitals:   12/07/13 1245  BP: 108/75  Pulse: 80  Temp:   Resp: 26    Post vital signs: Reviewed  Level of consciousness: awake  Complications: No apparent anesthesia complications

## 2013-12-07 NOTE — H&P (Signed)
Kendra Molina is a 33 y.o. [phone] at 39'0 presenting for RCS. Pt notes no contractions. Good fetal movement, No vaginal bleeding, Not leaking fluid.  [facility] at [facility] since 8 wks - Dated by LMP c/w 6 wk u/s - PCS at 34 wks for bleeding placenta previa - Unexplained infertility. OI/ IUI pregnancy - nl Harmony, nl NT, nl pan screening panel - persistent sore throat through preg, plan ENT eval PP   Prenatal Transfer Tool  Maternal Diabetes: No Genetic Screening: Normal Maternal Ultrasounds/Referrals: Normal Fetal Ultrasounds or other Referrals:  None Maternal Substance Abuse:  No Significant Maternal Medications:  None Significant Maternal Lab Results: None     OB History   Grav Para Term Preterm Abortions TAB SAB Ect Mult Living   3 1  1 1  1   1      Past Medical History  Diagnosis Date  . Raynaud's disease (toes)-negative testing for lupus  . GERD (gastroesophageal reflux disease)   . History of placenta abruption   . History of blood transfusion 2012   Past Surgical History  Procedure Laterality Date  . Removal of birthmark  right cheek (x4 surgeries)-[place] 1996  . Wisdom tooth extraction  2001  . Cesarean section     Family History: family history includes Coronary artery disease in her father; Diabetes in her paternal grandmother; Glaucoma in her mother; Gout in her father; Hypertension in her father; Nephrolithiasis in her father and sister. Social History:  reports that she has never smoked. She has never used smokeless tobacco. She reports that she does not drink alcohol or use illicit drugs.  Review of Systems - Negative except discomfort of preg     Last menstrual period 01/31/2013.  Physical Exam:   Filed Vitals:   12/07/13 0925  BP: 134/84  Pulse: 87  Temp: 98.1 F (36.7 C)  Resp: 18  SpO2: 100%    Gen: well appearing, no distress    Abd: gravid, NT, no RUQ pain LE: trace edema, equal bilaterally, non-tender FH present  CBC     Component Value Date/Time   WBC 9.0 12/07/2013 0928   RBC 3.51* 12/07/2013 0928   HGB 11.1* 12/07/2013 0928   HCT 33.3* 12/07/2013 0928   PLT 106* 12/07/2013 0928   MCV 94.9 12/07/2013 0928   MCH 31.6 12/07/2013 0928   MCHC 33.3 12/07/2013 0928   RDW 13.3 12/07/2013 0928     Prenatal labs: ABO, Rh: --/--/A POS (04/14 0845) Antibody: NEG (04/14 0845) Rubella:  immune RPR: NON REAC (04/14 0845)  HBsAg: Negative (09/11 0000)  HIV: Non-reactive (09/11 0000)  GBS:   neg 1 hr Glucola 101   Genetic screening nl screening  Anatomy US nl   Assessment/Plan: 33 y.o. [id number] at 39'0 - for RCS, R/B d/w pt who agrees to proceed   Tresa EndoKelly A. Romonia Yanik 12/07/2013, 8:18 AM

## 2013-12-07 NOTE — Anesthesia Preprocedure Evaluation (Signed)
Anesthesia Evaluation  Patient identified by MRN, date of birth, ID band Patient awake    Reviewed: Allergy & Precautions, H&P , NPO status , Patient's Chart, lab work & pertinent test results, reviewed documented beta blocker date and time   History of Anesthesia Complications Negative for: history of anesthetic complications  Airway Mallampati: III TM Distance: >3 FB Neck ROM: full    Dental  (+) Teeth Intact   Pulmonary neg pulmonary ROS,  breath sounds clear to auscultation        Cardiovascular negative cardio ROS  Rhythm:regular Rate:Normal     Neuro/Psych raynauds (toes) negative psych ROS   GI/Hepatic Neg liver ROS, GERD-  Medicated,  Endo/Other  negative endocrine ROS  Renal/GU negative Renal ROS  negative genitourinary   Musculoskeletal   Abdominal   Peds  Hematology  (+) Blood dyscrasia (thrombocytopenia - plt 106), , Blood transfusion with prior delivery   Anesthesia Other Findings   Reproductive/Obstetrics (+) Pregnancy (h/o c/s x1 (STAT for previa/abruption under spinal at 34 weeks), now for repeat)                           Anesthesia Physical Anesthesia Plan  ASA: II  Anesthesia Plan: Spinal   Post-op Pain Management:    Induction:   Airway Management Planned:   Additional Equipment:   Intra-op Plan:   Post-operative Plan:   Informed Consent: I have reviewed the patients History and Physical, chart, labs and discussed the procedure including the risks, benefits and alternatives for the proposed anesthesia with the patient or authorized representative who has indicated his/her understanding and acceptance.     Plan Discussed with: Surgeon and CRNA  Anesthesia Plan Comments:         Anesthesia Quick Evaluation

## 2013-12-07 NOTE — Transfer of Care (Signed)
Immediate Anesthesia Transfer of Care Note  Patient: Kendra Molina  Procedure(s) Performed: Procedure(s) with comments: Repeat CESAREAN SECTION (N/A) - EDD: 12/14/13  Patient Location: PACU  Anesthesia Type:Spinal  Level of Consciousness: awake, alert  and oriented  Airway & Oxygen Therapy: Patient Spontanous Breathing  Post-op Assessment: Report given to PACU RN  Post vital signs: Reviewed  Complications: No apparent anesthesia complications

## 2013-12-07 NOTE — Op Note (Signed)
12/07/2013  12:16 PM  PATIENT:  Kendra Molina  33 y.o. female  PRE-OPERATIVE DIAGNOSIS:  Previous Cesarean Section  POST-OPERATIVE DIAGNOSIS:  Previous Cesarean Section  PROCEDURE:  Procedure(s) with comments: Repeat CESAREAN SECTION (N/A) - EDD: 12/14/13 with 2 layer closure  SURGEON:  Surgeon(s) and Role:    Tresa Endo* Dimetrius Montfort A. Ernestina PennaFogleman, MD - Primary  PHYSICIAN ASSISTANT:   ASSISTANTS: Arita Missawson, CNM   ANESTHESIA:   spinal  EBL:  Total I/O In: 2700 [I.V.:2700] Out: 900 [Urine:200; Blood:700]  BLOOD ADMINISTERED:none  DRAINS: Urinary Catheter (Foley)   LOCAL MEDICATIONS USED:  NONE  SPECIMEN:  Source of Specimen:  placenta  DISPOSITION OF SPECIMEN:  L&D  COUNTS:  YES  TOURNIQUET:  * No tourniquets in log *  DICTATION: .Note written in EPIC  PLAN OF CARE: Admit to inpatient   PATIENT DISPOSITION:  PACU - hemodynamically stable.   Delay start of Pharmacological VTE agent (>24hrs) due to surgical blood loss or risk of bleeding: yes   Findings:  @BABYSEXEBC @ infant,  APGAR (1 MIN): 9   APGAR (5 MINS): 9   APGAR (10 MINS):   Normal uterus, tubes and ovaries, normal placenta. 3VC, clear amniotic fluid R tube with 1 cm thick adhesion to anterior placenta, distal tube freely mobile and in close approximation to ovary  EBL: 700 cc Antibiotics:   2g Ancef Complications: none  Indications: This is a 33 y.o. year-old, G3P1 at 39'0 admitted for RCS. Risks benefits and alternatives of the procedure were discussed with the patient who agreed to proceed  Procedure:  After informed consent was obtained the patient was taken to the operating room where spinal anesthesia was initiated.  She was prepped and draped in the normal sterile fashion in dorsal supine position with a leftward tilt.  A foley catheter was in place.  A Pfannenstiel skin incision was made 2 cm above the pubic symphysis in the midline with the scalpel.  Dissection was carried down with the Bovie cautery until the  fascia was reached. The fascia was incised in the midline. The incision was extended laterally with the Mayo scissors. The inferior aspect of the fascial incision was grasped with the Coker clamps, elevated up and the underlying rectus muscles were dissected off sharply. The superior aspect of the fascial incision was grasped with the Coker clamps elevated up and the underlying rectus muscles were dissected off sharply.  The peritoneum was entered sharply. The peritoneal incision was extended superiorly and inferiorly with good visualization of the bladder. The bladder blade was inserted and palpation was done to assess the fetal position and the location of the uterine vessels. The lower segment of the uterus was incised sharply with the scalpel and extended  bluntly in the cephalo-caudal fashion. The infant was grasped, brought to the incision,  rotated and the infant was delivered with fundal pressure. The nose and mouth were bulb suctioned. The cord was clamped and cut. The infant was handed off to the waiting pediatrician. The placenta was expressed. The uterus was exteriorized. The uterus was cleared of all clots and debris. The uterine incision was repaired with 0 Vicryl in a running locked fashion.  A second layer of the same suture was used in an imbricating fashion to obtain excellent hemostasis.  The uterus was then returned to the abdomen, the gutters were cleared of all clots and debris. The uterine incision was reinspected and found to be hemostatic. The peritoneum was grasped and closed with 2-0 Vicryl in a running  fashion. The cut muscle edges and the underside of the fascia were inspected and found to be hemostatic. The fascia was closed with 0 Vicryl in two halves . The subcutaneous tissue was irrigated. Scarpa's layer was closed with a 2-0 plain gut suture. The skin was closed with a 4-0 Monocryl in a single layer. The patient tolerated the procedure well. Sponge lap and needle counts were correct  x3 and patient was taken to the recovery room in a stable condition.  Josuel Koeppen A. Jermal Dismuke 12/07/2013 12:17 PM

## 2013-12-08 LAB — CBC
HEMATOCRIT: 26 % — AB (ref 36.0–46.0)
Hemoglobin: 8.6 g/dL — ABNORMAL LOW (ref 12.0–15.0)
MCH: 31.3 pg (ref 26.0–34.0)
MCHC: 33.1 g/dL (ref 30.0–36.0)
MCV: 94.5 fL (ref 78.0–100.0)
Platelets: 91 10*3/uL — ABNORMAL LOW (ref 150–400)
RBC: 2.75 MIL/uL — ABNORMAL LOW (ref 3.87–5.11)
RDW: 13.1 % (ref 11.5–15.5)
WBC: 11.9 10*3/uL — ABNORMAL HIGH (ref 4.0–10.5)

## 2013-12-08 NOTE — Progress Notes (Signed)
Patient ID: Kendra Molina, female   DOB: July 14, 1981, 33 y.o.   MRN: [id number] POD # 1  Subjective: Pt reports feeling well / Pain controlled with percocet Tolerating po/ Foley d/c'ed and has voided x 2, >400cc each void / No n/v /Flatus neg Activity: out of bed and ambulate Bleeding is light Newborn info:  Information for the patient's newborn:  Cliffton AstersWhite, Girl Lyla SonCarrie [[id number]][[id number]]  female Feeding: breast   Objective: VS: Blood pressure 118/67, pulse 91, temperature 98.5 F (36.9 C), temperature source Oral, resp. rate 18   Intake/Output Summary (Last 24 hours) at 12/08/13 0938 Last data filed at 12/08/13 0520  Gross per 24 hour  Intake 4862.5 ml  Output   4075 ml  Net  787.5 ml      Recent Labs  12/07/13 0928 12/08/13 0600  WBC 9.0 11.9*  HGB 11.1* 8.6*  HCT 33.3* 26.0*  PLT 106* 91*    Blood type: A POS Rubella: Immune    Physical Exam:  General: alert, cooperative and no distress CV: Regular rate and rhythm Resp: clear Abdomen: soft, nontender, normal bowel sounds Incision: Covered with Tegaderm and honeycomb dressing; well approximated. Uterine Fundus: firm, below umbilicus, nontender Lochia: minimal Ext: Homans sign is negative, no sign of DVT and no edema, redness or tenderness in the calves or thighs    A/P: POD # 1 [id number] S/P Elective Repeat C/Section Gest Thrombocytopenia; will d/c ibuprofen and recheck CBC prior to d/c home\ Chronic anemia, worsened by ABL anemia; iron supplement after BM. ABL Anemia Doing well Continue routine post op orders   Signed: Demetrius RevelJulie K Colandra Ohanian, MSN, [facility]NP 12/08/2013, 9:38 AM

## 2013-12-08 NOTE — Lactation Note (Signed)
This note was copied from the chart of Kendra Janeice RobinsonCarrie Kasprzak. Lactation Consultation Note #20 NS for the Rt. Nipple and #24 NS for the Lt. Nipple. Lt. Nipple w/stripe and bifurcated larger. Patient Name: Kendra Molina ZOXWR'UToday's Date: 12/08/2013 Reason for consult: Follow-up assessment   Maternal Data    Feeding Feeding Type: Breast Fed Length of feed: 0 min  LATCH Score/Interventions Latch: Too sleepy or reluctant, no latch achieved, no sucking elicited. Intervention(s): Skin to skin;Teach feeding cues;Waking techniques Intervention(s): Adjust position;Assist with latch;Breast massage;Breast compression  Audible Swallowing: None Intervention(s): Skin to skin;Hand expression Intervention(s): Skin to skin;Hand expression;Alternate breast massage  Type of Nipple: Everted at rest and after stimulation (semi flat) Intervention(s): Shells;Hand pump (wants shield)  Comfort (Breast/Nipple): Soft / non-tender     Hold (Positioning): Assistance needed to correctly position infant at breast and maintain latch. Intervention(s): Support Pillows;Skin to skin  LATCH Score: 5  Lactation Tools Discussed/Used Tools: Shells;Nipple Shields;Pump;Other (comment) (curve tip syring) Nipple shield size: 20 Shell Type: Inverted Breast pump type: Manual   Consult Status Consult Status: Follow-up Date: 12/08/13 Follow-up type: In-patient    Charyl DancerLaura G Veralyn Lopp 12/08/2013, 1:45 AM

## 2013-12-08 NOTE — Lactation Note (Signed)
This note was copied from the chart of Girl Janeice RobinsonCarrie Seel. Lactation Consultation Note  Patient Name: Girl Janeice RobinsonCarrie Schoen ZOXWR'UToday's Date: 12/08/2013 Reason for consult: Follow-up assessment;Breast/nipple pain;Difficult latch.  RN, Chyrl CivatteJoAnn informed this LC that mom was not comfortable using the NS for latching and due to soreness, requested a DEBP.  RN provided pump and comfort gelpads to mom for use between pumping for nipple care.  LC had attempted to visit earlier but family visiting (including young sibling).   Maternal Data    Feeding    LATCH Score/Interventions         Most recent LATCH score=5 assessed by LC last night; baby's output meeting guidelines but only 5 feeds (10-20 minutes each)  documented at 36 hours of life             Lactation Tools Discussed/Used Tools: Comfort gels Breast pump type: Double-Electric Breast Pump Date initiated:: 12/08/13   Consult Status Consult Status: Follow-up Date: 12/09/13 Follow-up type: In-patient    Zara ChessJoanne P Lebron Nauert 12/08/2013, 10:55 PM

## 2013-12-08 NOTE — Lactation Note (Signed)
This note was copied from the chart of [name] Eggebrecht. Lactation Consultation Note Attempting to latch baby when I entered rm. Baby had Lg. Emesis. Sleepy. Not interested in feeding. Hand pump colostrum. Noted semi flat nipples. Mom's breast filling w/colostrum. Encouraged to pump, curve tip syring given to give colostrum to stimulate baby. Shells given to wear in AM w/bra to evert nipple for easier latch. Mom stated she had to wear shield with her first baby for a little while. #20 NS given to try when baby interested in feeding. Experienced mom fed first child 9 months. Mom encouraged to do skin-to-skin. Patient Name: [name] Kolek WUJWJ'XToday's Date: 12/08/2013 Reason for consult: Follow-up assessment   Maternal Data    Feeding Feeding Type: Breast Fed Length of feed: 0 min  LATCH Score/Interventions Latch: Too sleepy or reluctant, no latch achieved, no sucking elicited. Intervention(s): Skin to skin;Teach feeding cues;Waking techniques Intervention(s): Adjust position;Assist with latch;Breast massage;Breast compression  Audible Swallowing: None Intervention(s): Skin to skin;Hand expression Intervention(s): Skin to skin;Hand expression;Alternate breast massage  Type of Nipple: Everted at rest and after stimulation (semi flat) Intervention(s): Shells;Hand pump (wants shield)  Comfort (Breast/Nipple): Soft / non-tender     Hold (Positioning): Assistance needed to correctly position infant at breast and maintain latch. Intervention(s): Support Pillows;Skin to skin  LATCH Score: 5  Lactation Tools Discussed/Used Tools: Shells;Nipple Shields;Pump;Other (comment) (curve tip syring) Nipple shield size: 20 Shell Type: Inverted Breast pump type: Manual   Consult Status Consult Status: Follow-up Date: 12/08/13 Follow-up type: In-patient    Charyl DancerLaura G Jamison Yuhasz 12/08/2013, 1:32 AM

## 2013-12-08 NOTE — Anesthesia Postprocedure Evaluation (Signed)
  Anesthesia Post-op Note  Patient: Kendra Molina  Procedure(s) Performed: Procedure(s) with comments: Repeat CESAREAN SECTION (N/A) - EDD: 12/14/13  Patient Location: Mother/Baby  Anesthesia Type:Spinal  Level of Consciousness: awake, alert  and oriented  Airway and Oxygen Therapy: Patient Spontanous Breathing  Post-op Pain: mild  Post-op Assessment: Patient's Cardiovascular Status Stable, Respiratory Function Stable, Patent Airway, No signs of Nausea or vomiting, Pain level controlled, No headache, No backache, No residual numbness and No residual motor weakness  Post-op Vital Signs: Reviewed and stable  Last Vitals:  Filed Vitals:   12/08/13 0500  BP: 118/67  Pulse: 91  Temp: 36.9 C  Resp: 18    Complications: No apparent anesthesia complications

## 2013-12-08 NOTE — Addendum Note (Signed)
Addendum created 12/08/13 0810 by Lincoln BrighamAngela Draughon Navjot Pilgrim, CRNA   Modules edited: Notes Section   Notes Section:  File: [id number]

## 2013-12-09 LAB — PLATELET COUNT: Platelets: 116 10*3/uL — ABNORMAL LOW (ref 150–400)

## 2013-12-09 MED ORDER — POLYSACCHARIDE IRON COMPLEX 150 MG PO CAPS
150.0000 mg | ORAL_CAPSULE | Freq: Two times a day (BID) | ORAL | Status: DC
  Filled 2013-12-09 (×2): qty 1

## 2013-12-09 MED ORDER — OXYCODONE-ACETAMINOPHEN 5-325 MG PO TABS: 1.0000 | ORAL_TABLET | ORAL | Status: DC | PRN

## 2013-12-09 MED ORDER — IBUPROFEN 600 MG PO TABS: 600.0000 mg | ORAL_TABLET | Freq: Four times a day (QID) | ORAL | Status: DC | PRN

## 2013-12-09 MED ORDER — POLYSACCHARIDE IRON COMPLEX 150 MG PO CAPS: 150.0000 mg | ORAL_CAPSULE | Freq: Two times a day (BID) | ORAL | Status: DC

## 2013-12-09 NOTE — Progress Notes (Signed)
POD # 2  Subjective: Pt reports feeling well, desires early discharge/ Pain controlled with Percocet No bruising, bleeding, SOB, or dizziness Tolerating po/Voiding without problems/ No n/v / +Belching Activity: ad lib Bleeding is light Newborn info:  Information for the patient's newborn:  Knox SalivaWhite, Girl Shala [[id number]][[id number]]  female Feeding: breast   Objective: VS:  Filed Vitals:   12/08/13 0900 12/08/13 1306 12/08/13 1755 12/09/13 0525  BP: 119/67 113/72 125/76 116/72  Pulse: 90 80 92 84  Temp: 98.1 F (36.7 C) 98.8 F (37.1 C) 99.9 F (37.7 C) 98 F (36.7 C)  TempSrc: Oral Oral Oral Oral  Resp: 16 16 18 18   Weight:      SpO2: 97% 96%      I&O: Intake/Output     04/17 0701 - 04/18 0700 04/18 0701 - 04/19 0700   P.O.     I.V. (mL/kg)     Total Intake(mL/kg)     Urine (mL/kg/hr) 800 (0.5)    Blood     Total Output 800     Net -800            LABS:  Recent Labs  12/07/13 0928 12/08/13 0600  WBC 9.0 11.9*  HGB 11.1* 8.6*  HCT 33.3* 26.0*  PLT 106* 91*    Blood type: --/--/A POS (04/14 0845) Rubella: Immune (09/11 0000)     Physical Exam:  General: alert and cooperative CV: Regular rate and rhythm Resp: CTA bilaterally Abdomen: soft, nontender, normal bowel sounds Uterine Fundus: firm, below umbilicus, nontender Incision: Covered with Tegaderm and honeycomb dressing; no significant drainage; well approximated. Lochia: minimal Ext: extremities normal, atraumatic, no cyanosis or edema and Homans sign is negative, no sign of DVT    Assessment/: POD # 2/ G3P0112/ S/P C/Section d/t repeat Thrombocytopenia IDA with compounding ABL anemia Doing well  Plan: Check platelet count, if >100 Rx for Ibuprofen Discharge home tomorrow Rx's Percocet 1-2 po q 4 hrs prn Niferex 150 mg po bid PNV daily   Signed: Lawernce PittsMelanie N Romey Mathieson, MSN, CNM 12/09/2013, 11:49 AM

## 2013-12-10 NOTE — Discharge Summary (Signed)
Reviewed and agree with note and plan. V.Amyre Segundo, MD  

## 2013-12-10 NOTE — Discharge Summary (Signed)
POSTOPERATIVE DISCHARGE SUMMARY:  Patient ID: Kendra Molina MRN: [id number] DOB/AGE: [birth year] 33 y.o.  Admit date: 12/07/2013 Admission Diagnoses: 39.[redacted] weeks gestation, previous Cesarean Section   Discharge date: 12/10/2013 Discharge Diagnoses: S/P Repeat C/S on /17/15        Prenatal history: [id number]   EDC : 12/14/2013, by Last Menstrual Period  [facility] since [redacted] wks gestation. Primary provider : Dr. Ernestina PennaFogleman Prenatal course complicated by previous CS, infertility-Femara pregnancy, persistent sore throat  Prenatal labs: ABO, Rh: --/--/A POS (04/14 0845)  Antibody: NEG (04/14 0845) Rubella:   Immune RPR: NON REAC (04/14 0845)  HBsAg: Negative (09/11 0000)  HIV: Non-reactive (09/11 0000)  GBS:   Neg GTT: WNL  Medical / Surgical History :  Past medical history:  Past Medical History  Diagnosis Date  . Raynaud's disease (toes)-negative testing for lupus  . GERD (gastroesophageal reflux disease)   . History of placenta abruption   . History of blood transfusion 2012  . S/P Repeat low transverse cesarean section (4/16) 12/07/2013    Past surgical history:  Past Surgical History  Procedure Laterality Date  . Removal of birthmark  right cheek (x4 surgeries)-[place] 1996  . Wisdom tooth extraction  2001  . Cesarean section       Medications on Admission: No prescriptions prior to admission    Allergies: Review of patient's allergies indicates no known allergies.   Intrapartum Course:  Admited for scheduled repeat CS  Postpartum Course: Thrombocytopenia, IDA with compounding ABL anemia  Physical Exam:   VSS: Blood pressure 116/72, pulse 84, temperature 98 F (36.7 C), temperature source Oral, resp. rate 18, weight 69.4 kg (153 lb), last menstrual period 03/09/2013, SpO2 96.00%, unknown if currently breastfeeding.  LABS:  Recent Labs  12/08/13 0600 12/09/13 1207  WBC 11.9*  --   HGB 8.6*  --   PLT 91*  116*    Newborn Data Live born female  Birth Weight: 8 lb 14.3 oz (4034 g) APGAR: 9, 9  See operative report for further details  Home with mother.  Discharge Instructions:  Wound Care: keep clean and dry / remove honeycomb POD 6 Postpartum Instructions: [facility] discharge booklet - instructions reviewed Medications:    Medication List    STOP taking these medications       ranitidine 150 MG tablet  Commonly known as:  ZANTAC      TAKE these medications       ibuprofen 600 MG tablet  Commonly known as:  ADVIL,MOTRIN  Take 1 tablet (600 mg total) by mouth every 6 (six) hours as needed (pain).     iron polysaccharides 150 MG capsule  Commonly known as:  NIFEREX  Take 1 capsule (150 mg total) by mouth 2 (two) times daily.     multivitamin-prenatal 27-0.8 MG Tabs tablet  Take 1 tablet by mouth daily at 12 noon.     oxyCODONE-acetaminophen 5-325 MG per tablet  Commonly known as:  PERCOCET/ROXICET  Take 1-2 tablets by mouth every 4 (four) hours as needed for severe pain (moderate - severe pain).           Follow-up Information   [facility] 6 weeks.   Specialty:  Obstetrics and Gynecology   Contact information:   [address] [place] [address] [phone]         Signed: Lawernce PittsMelanie N Shontia Gillooly [facility]NP-BC, MSN 12/10/2013, 12:21 PM

## 2013-12-12 ENCOUNTER — Encounter (HOSPITAL_COMMUNITY): Payer: Self-pay | Admitting: Obstetrics

## 2014-01-02 ENCOUNTER — Ambulatory Visit (HOSPITAL_COMMUNITY)
Admission: RE | Admit: 2014-01-02 | Discharge: 2014-01-02 | Disposition: A | Discharge status: Home / Self Care | Payer: Federal, State, Local not specified - PPO | Source: Ambulatory Visit | Attending: Obstetrics | Admitting: Obstetrics

## 2014-01-02 NOTE — Lactation Note (Signed)
Lactation Consultation Outpatient Visit Note  Mom is here today because Kendra Molina is not latching and is fussy at the breast. SHe is bottle feeding for the most part.  Oral assessment initially revealed a hyperactive gag reflux but with exercises it relaxed and she pulled a gloved finger deep into her mouth and maintained a vacuum.  Occasional snapback was heard during feedings.  Assisted mom with softening the areola through hand expression and reverse pressure exercises.  Kendra Molina was placed in a FB hold and after a few attempts she latched well and transferred 64 ml.  SHe was content after this and mom reported that usually she was fussy after feedings.   Mom has a very strong MER on the left side and when we did try to latch Kendra Molina to that side she was flooded with milk.  We expressed some of the milk and eventually she latched with a #20 NS.  SHowed mom asymmetrical latch and how to pull [name] in by the shoulders  Mom reports that Kendra Molina did much better with this positioning and that she maintained latch much better than she had been.  SHe will continue to try and latch her at home and follow-up with us as desired.     Patient Name: Kendra Molina Date of Birth: 1981-01-14 Birth Weight:  8+14 Gestational Age at Delivery: Gestational Age: <None> 2839 Type of Delivery: c-section  Breastfeeding History Frequency of Breastfeeding: Just at night recently because baby has been restless Length of Feeding: 10-15 Voids: 6+ Stools: 10  Supplementing / Method: Pumping:  Type of Pump: PIS, ameda   Frequency:  Volume: feeding [name] 4 oz every 2-3 hours  Comments:    Consultation Evaluation:  Initial Feeding Assessment: Pre-feed [id number]:4406 Post-feed UJWJXB:1478Weight:4470 Amount Transferred:64 Comments:  Additional Feeding Assessment: Pre-feed [id number]:3086Weight:4470 Post-feed VHQION:[id number]:4484 Amount Transferred:14 Comments:   Total Breast milk Transferred this Visit: 78 Total Supplement Given:   Additional  Interventions:   Follow-Up prn Suggested support group.    Soyla DryerMaryann Draven Laine 01/02/2014, 1:07 PM

## 2014-04-04 ENCOUNTER — Encounter: Payer: Self-pay | Admitting: Family Medicine

## 2014-04-04 ENCOUNTER — Ambulatory Visit (INDEPENDENT_AMBULATORY_CARE_PROVIDER_SITE_OTHER): Payer: Federal, State, Local not specified - PPO | Admitting: Family Medicine

## 2014-04-04 VITALS — BP 118/72 | HR 64 | Ht 62.0 in | Wt 122.0 lb

## 2014-04-04 DIAGNOSIS — Z0000 Encounter for general adult medical examination without abnormal findings: Secondary | ICD-10-CM

## 2014-04-04 DIAGNOSIS — D696 Thrombocytopenia, unspecified: Secondary | ICD-10-CM

## 2014-04-04 DIAGNOSIS — Z13220 Encounter for screening for lipoid disorders: Secondary | ICD-10-CM

## 2014-04-04 DIAGNOSIS — L659 Nonscarring hair loss, unspecified: Secondary | ICD-10-CM

## 2014-04-04 LAB — POCT URINALYSIS DIPSTICK
Bilirubin, UA: NEGATIVE
Blood, UA: NEGATIVE
Glucose, UA: NEGATIVE
KETONES UA: NEGATIVE
LEUKOCYTES UA: NEGATIVE
NITRITE UA: NEGATIVE
PH UA: 5
Protein, UA: NEGATIVE
Spec Grav, UA: <=1.005
UROBILINOGEN UA: NEGATIVE

## 2014-04-04 LAB — CBC WITH DIFFERENTIAL/PLATELET
Basophils Absolute: 0 10*3/uL (ref 0.0–0.1)
Basophils Relative: 0 % (ref 0–1)
Eosinophils Absolute: 0.1 10*3/uL (ref 0.0–0.7)
Eosinophils Relative: 1 % (ref 0–5)
HEMATOCRIT: 37.7 % (ref 36.0–46.0)
Hemoglobin: 13.1 g/dL (ref 12.0–15.0)
LYMPHS ABS: 3.2 10*3/uL (ref 0.7–4.0)
LYMPHS PCT: 39 % (ref 12–46)
MCH: 30.8 pg (ref 26.0–34.0)
MCHC: 34.7 g/dL (ref 30.0–36.0)
MCV: 88.7 fL (ref 78.0–100.0)
Monocytes Absolute: 0.6 10*3/uL (ref 0.1–1.0)
Monocytes Relative: 7 % (ref 3–12)
Neutro Abs: 4.3 10*3/uL (ref 1.7–7.7)
Neutrophils Relative %: 53 % (ref 43–77)
PLATELETS: 238 10*3/uL (ref 150–400)
RBC: 4.25 MIL/uL (ref 3.87–5.11)
RDW: 14.8 % (ref 11.5–15.5)
WBC: 8.1 10*3/uL (ref 4.0–10.5)

## 2014-04-04 LAB — LIPID PANEL
CHOLESTEROL: 162 mg/dL (ref 0–200)
HDL: 67 mg/dL (ref >39)
LDL Cholesterol: 84 mg/dL (ref 0–99)
Total CHOL/HDL Ratio: 2.4 Ratio
Triglycerides: 57 mg/dL (ref <150)
VLDL: 11 mg/dL (ref 0–40)

## 2014-04-04 LAB — TSH: TSH: 1.457 u[IU]/mL (ref 0.350–4.500)

## 2014-04-04 LAB — GLUCOSE, RANDOM: Glucose, Bld: 83 mg/dL (ref 70–99)

## 2014-04-04 NOTE — Progress Notes (Signed)
Chief Complaint  Patient presents with  . Annual Exam    fasting (last  meal 8:30am) annual exam, no pap-sees Dr.Fogleman and is UTD. UA shows 1+ leuks, no symptoms. Only complaint today is thinning hair and oily skin that just started a few weeks ago.    Kendra Molina is a 33 y.o. female who presents for a complete physical.  She has the following concerns:  She noticed thinning of her hair and oily skin within the last month.  She is 4 months post-partum. She denies other hyperthyroid symptoms--no palpitations, heat intolerance.  She has some fatigue as expected having a 32 month old.  She had a dizzy spell last week--she checked her BP and it was fine.  Thinks she may have been a little dehydrated.  Immunization History  Administered Date(s) Administered  . Tdap 09/22/2013  she gets yearly flu shots at work Last Pap smear: May 2015 (no abnormals) Last mammogram: never Last colonoscopy: never Last DEXA: never Dentist: twice yearly Ophtho: about 7 years Exercise: jogs 2-3 times/week.  Lifts children (35# son) daily. She had normal lipid panel through biometrics at work in 2014.  Past Medical History  Diagnosis Date  . Raynaud's disease     (toes)-negative testing for lupus  . GERD (gastroesophageal reflux disease)     with pregnancy  . History of placenta abruption   . History of blood transfusion 2012  . S/P Repeat low transverse cesarean section (4/16) 12/07/2013    Past Surgical History  Procedure Laterality Date  . Removal of birthmark Right 1996    right cheek (x4 surgeries)-[place]   . Wisdom tooth extraction  2001  . Cesarean section  11/03/2010  . Cesarean section N/A 12/07/2013    Procedure: Repeat CESAREAN SECTION;  Surgeon: Tresa Endo A. Ernestina Penna, MD;  Location: [facility];  Service: Obstetrics;  Laterality: N/A;  EDD: 12/14/13    History   Social History  . Marital Status: Married    Spouse Name: N/A    Number of Children: N/A  . Years of Education: N/A    Occupational History  . Not on file.   Social History Main Topics  . Smoking status: Never Smoker   . Smokeless tobacco: Never Used  . Alcohol Use: Yes     Comment: 3 drinks a week  . Drug Use: No  . Sexual Activity: Yes    Partners: Male    Birth Control/ Protection: Pill   Other Topics Concern  . Not on file   Social History Narrative   Married, 1 son, 1 daughter.  Works at [employer] History  Problem Relation Age of Onset  . Arthritis Mother     OA in her hands  . Hypertension Father   . Coronary artery disease Father     CABG @ 52  . Gout Father   . Nephrolithiasis Father   . Cancer Father     prostate  . Prostate cancer Father 49  . Nephrolithiasis Sister   . Diabetes Paternal Grandmother   . Asthma Son   . Breast cancer Neg Hx   . Colon cancer Neg Hx     Current outpatient prescriptions:CAMILA 0.35 MG tablet, Take 1 tablet by mouth daily. , Disp: , Rfl: ;  Prenatal Vit-Fe Fumarate-FA (MULTIVITAMIN-PRENATAL) 27-0.8 MG TABS tablet, Take 1 tablet by mouth daily at 12 noon., Disp: , Rfl:   No Known Allergies  ROS:  The patient denies anorexia, fever, headaches,  vision changes,  decreased hearing, ear pain, sore throat, breast concerns, chest pain, palpitations, dizziness, syncope, dyspnea on exertion, cough, swelling, nausea, vomiting, diarrhea, constipation, abdominal pain, melena, hematochezia, indigestion/heartburn, hematuria, incontinence, dysuria, irregular menstrual cycles, vaginal discharge, odor or itch, genital lesions, joint pains, numbness, tingling, weakness, tremor, suspicious skin lesions, depression, anxiety, abnormal bleeding/bruising, or enlarged lymph nodes. She is doing weight watchers, and is close to her pre-pregnancy weight.  Goal is 117# Hair loss and oily skin as per HPI  PHYSICAL EXAM:  BP 118/72  Pulse 64  Ht 5\' 2"  (1.575 m)  Wt 122 lb (55.339 kg)  BMI 22.31 kg/m2  Breastfeeding? Yes  General Appearance:    Alert,  cooperative, no distress, appears stated age  Head:    Normocephalic, without obvious abnormality, atraumatic  Eyes:    PERRL, conjunctiva/corneas clear, EOM's intact, fundi    benign  Ears:    Normal TM's and external ear canals  Nose:   Nares normal, mucosa normal, no drainage or sinus   tenderness  Throat:   Lips, mucosa, and tongue normal; teeth and gums normal  Neck:   Supple, no lymphadenopathy;  thyroid:  no   enlargement/tenderness/nodules; no carotid   bruit or JVD  Back:    Spine nontender, no curvature, ROM normal, no CVA     tenderness  Lungs:     Clear to auscultation bilaterally without wheezes, rales or     ronchi; respirations unlabored  Chest Wall:    No tenderness or deformity   Heart:    Regular rate and rhythm, S1 and S2 normal, no murmur, rub   or gallop  Breast Exam:    Deferred to GYN  Abdomen:     Soft, non-tender, nondistended, normoactive bowel sounds,    no masses, no hepatosplenomegaly  Genitalia:    Deferred to GYN     Extremities:   No clubbing, cyanosis or edema  Pulses:   2+ and symmetric all extremities  Skin:   Skin color, texture, turgor normal, no rashes or lesions. Scalp is normal, no patchy alopecia  Lymph nodes:   Cervical, supraclavicular, and axillary nodes normal  Neurologic:   CNII-XII intact, normal strength, sensation and gait; reflexes 2+ and symmetric throughout          Psych:   Normal mood, affect, hygiene and grooming.     ASSESSMENT/PLAN:  Routine general medical examination at a health care facility - Plan: POCT Urinalysis Dipstick, Visual acuity screening, Lipid panel, CBC with Differential, TSH, Glucose, random  Screening for lipoid disorders - Plan: Lipid panel  Thrombocytopenia, unspecified - Plan: CBC with Differential  Hair loss - Plan: TSH  Discussed monthly self breast exams and yearly mammograms after the age of 33; at least 30 minutes of aerobic activity at least 5 days/week, weight-bearing exercise 2x/wk; proper  sunscreen use reviewed; healthy diet, including goals of calcium and vitamin D intake and alcohol recommendations (less than or equal to 1 drink/day) reviewed; regular seatbelt use; changing batteries in smoke detectors.  Immunization recommendations discussed--will get flu shot at work.  Colonoscopy recommendations reviewed-age 26, sooner if changes in family history or GI concerns develop   Lipids, glu (needed for form for work) TSH CBC (low platelets noted in hospital in April and Hg 8.6 at discharge)

## 2014-04-04 NOTE — Patient Instructions (Signed)

## 2014-06-25 ENCOUNTER — Encounter: Payer: Self-pay | Admitting: Family Medicine

## 2016-02-27 DIAGNOSIS — R350 Frequency of micturition: Secondary | ICD-10-CM | POA: Diagnosis not present

## 2016-02-27 DIAGNOSIS — Z130 Encounter for screening for diseases of the blood and blood-forming organs and certain disorders involving the immune mechanism: Secondary | ICD-10-CM | POA: Diagnosis not present

## 2016-02-27 DIAGNOSIS — Z6821 Body mass index (BMI) 21.0-21.9, adult: Secondary | ICD-10-CM | POA: Diagnosis not present

## 2016-02-27 DIAGNOSIS — Z1329 Encounter for screening for other suspected endocrine disorder: Secondary | ICD-10-CM | POA: Diagnosis not present

## 2016-02-27 DIAGNOSIS — Z13220 Encounter for screening for lipoid disorders: Secondary | ICD-10-CM | POA: Diagnosis not present

## 2016-02-27 DIAGNOSIS — Z0000 Encounter for general adult medical examination without abnormal findings: Secondary | ICD-10-CM | POA: Diagnosis not present

## 2016-02-27 DIAGNOSIS — Z01419 Encounter for gynecological examination (general) (routine) without abnormal findings: Secondary | ICD-10-CM | POA: Diagnosis not present

## 2016-02-27 DIAGNOSIS — R3915 Urgency of urination: Secondary | ICD-10-CM | POA: Diagnosis not present

## 2016-06-18 ENCOUNTER — Encounter: Payer: Self-pay | Admitting: *Deleted

## 2017-02-21 LAB — HM PAP SMEAR: HM Pap smear: NORMAL

## 2017-03-11 DIAGNOSIS — Z6822 Body mass index (BMI) 22.0-22.9, adult: Secondary | ICD-10-CM | POA: Diagnosis not present

## 2017-03-11 DIAGNOSIS — Z01419 Encounter for gynecological examination (general) (routine) without abnormal findings: Secondary | ICD-10-CM | POA: Diagnosis not present

## 2017-03-11 DIAGNOSIS — Z1151 Encounter for screening for human papillomavirus (HPV): Secondary | ICD-10-CM | POA: Diagnosis not present

## 2017-07-26 DIAGNOSIS — K080 Exfoliation of teeth due to systemic causes: Secondary | ICD-10-CM | POA: Diagnosis not present

## 2017-12-29 DIAGNOSIS — L853 Xerosis cutis: Secondary | ICD-10-CM | POA: Diagnosis not present

## 2017-12-29 DIAGNOSIS — D225 Melanocytic nevi of trunk: Secondary | ICD-10-CM | POA: Diagnosis not present

## 2017-12-29 DIAGNOSIS — L814 Other melanin hyperpigmentation: Secondary | ICD-10-CM | POA: Diagnosis not present

## 2018-04-20 DIAGNOSIS — Z01419 Encounter for gynecological examination (general) (routine) without abnormal findings: Secondary | ICD-10-CM | POA: Diagnosis not present

## 2018-04-20 DIAGNOSIS — Z6822 Body mass index (BMI) 22.0-22.9, adult: Secondary | ICD-10-CM | POA: Diagnosis not present

## 2018-04-29 DIAGNOSIS — R1900 Intra-abdominal and pelvic swelling, mass and lump, unspecified site: Secondary | ICD-10-CM | POA: Diagnosis not present

## 2018-06-24 DIAGNOSIS — H01112 Allergic dermatitis of right lower eyelid: Secondary | ICD-10-CM | POA: Diagnosis not present

## 2019-03-02 DIAGNOSIS — I788 Other diseases of capillaries: Secondary | ICD-10-CM | POA: Diagnosis not present

## 2019-03-02 DIAGNOSIS — D225 Melanocytic nevi of trunk: Secondary | ICD-10-CM | POA: Diagnosis not present

## 2019-03-02 DIAGNOSIS — D1801 Hemangioma of skin and subcutaneous tissue: Secondary | ICD-10-CM | POA: Diagnosis not present

## 2019-03-02 DIAGNOSIS — L821 Other seborrheic keratosis: Secondary | ICD-10-CM | POA: Diagnosis not present

## 2019-04-26 DIAGNOSIS — Z01419 Encounter for gynecological examination (general) (routine) without abnormal findings: Secondary | ICD-10-CM | POA: Diagnosis not present

## 2019-04-26 DIAGNOSIS — Z0000 Encounter for general adult medical examination without abnormal findings: Secondary | ICD-10-CM | POA: Diagnosis not present

## 2019-04-26 DIAGNOSIS — Z1329 Encounter for screening for other suspected endocrine disorder: Secondary | ICD-10-CM | POA: Diagnosis not present

## 2019-04-26 DIAGNOSIS — Z13220 Encounter for screening for lipoid disorders: Secondary | ICD-10-CM | POA: Diagnosis not present

## 2019-04-26 DIAGNOSIS — Z131 Encounter for screening for diabetes mellitus: Secondary | ICD-10-CM | POA: Diagnosis not present

## 2019-04-26 DIAGNOSIS — Z130 Encounter for screening for diseases of the blood and blood-forming organs and certain disorders involving the immune mechanism: Secondary | ICD-10-CM | POA: Diagnosis not present

## 2019-04-26 DIAGNOSIS — Z6821 Body mass index (BMI) 21.0-21.9, adult: Secondary | ICD-10-CM | POA: Diagnosis not present

## 2019-07-12 DIAGNOSIS — Z20828 Contact with and (suspected) exposure to other viral communicable diseases: Secondary | ICD-10-CM | POA: Diagnosis not present

## 2019-07-12 DIAGNOSIS — Z7189 Other specified counseling: Secondary | ICD-10-CM | POA: Diagnosis not present

## 2019-08-23 ENCOUNTER — Ambulatory Visit: Payer: Federal, State, Local not specified - PPO | Attending: Internal Medicine

## 2019-08-23 DIAGNOSIS — Z20822 Contact with and (suspected) exposure to covid-19: Secondary | ICD-10-CM

## 2019-08-23 DIAGNOSIS — Z20828 Contact with and (suspected) exposure to other viral communicable diseases: Secondary | ICD-10-CM | POA: Diagnosis not present

## 2019-08-24 LAB — NOVEL CORONAVIRUS, NAA: SARS-CoV-2, NAA: DETECTED — AB

## 2020-02-09 ENCOUNTER — Telehealth: Payer: Self-pay

## 2020-02-09 NOTE — Telephone Encounter (Signed)
Pt. Called wanting to know if she could schedule a CPE with you and said it had been a while since she had been here. I looked up her last apt. Which was 04/04/14. I told her I would have to run this by the provider first before we can schedule her because she would be considered a new pt.

## 2020-02-09 NOTE — Telephone Encounter (Signed)
I don't have a problem accepting her back BUT-- She needs to know that my availability is limited--not sure when my next available CPE slot is--if not acceptable, she can be put on cancellation list, or offer Vickie if she prefers female provider. Also, since she hasn't been in the office for so long, she needs to be given new patient paperwork (this was NOT done with my patient who had moved out of town for 3 years and I saw this week to re-establish), and we need to get records from other providers that she may have seen in the interim (not sure if she only saw her GYN, in which case just get those, vs seeing others and getting labs or vaccines elsewhere).

## 2020-02-09 NOTE — Telephone Encounter (Signed)
Sorry the original message was supposed to go to Dr. Lynelle Doctor she requested a CPE with Dr. Lynelle Doctor.

## 2020-02-09 NOTE — Telephone Encounter (Signed)
Fine with me

## 2020-02-09 NOTE — Telephone Encounter (Signed)
Patient can be scheduled 

## 2020-02-12 NOTE — Telephone Encounter (Signed)
I got the pt. Scheduled for a CPE on 09/18/20 and put on the waiting list for an earlier apt.

## 2020-03-05 NOTE — Patient Instructions (Signed)
  HEALTH MAINTENANCE RECOMMENDATIONS:  It is recommended that you get at least 30 minutes of aerobic exercise at least 5 days/week (for weight loss, you may need as much as 60-90 minutes). This can be any activity that gets your heart rate up. This can be divided in 10-15 minute intervals if needed, but try and build up your endurance at least once a week.  Weight bearing exercise is also recommended twice weekly.  Eat a healthy diet with lots of vegetables, fruits and fiber.  "Colorful" foods have a lot of vitamins (ie green vegetables, tomatoes, red peppers, etc).  Limit sweet tea, regular sodas and alcoholic beverages, all of which has a lot of calories and sugar.  Up to 1 alcoholic drink daily may be beneficial for women (unless trying to lose weight, watch sugars).  Drink a lot of water.  Calcium recommendations are 1200-1500 mg daily (1500 mg for postmenopausal women or women without ovaries), and vitamin D 1000 IU daily.  This should be obtained from diet and/or supplements (vitamins), and calcium should not be taken all at once, but in divided doses.  Monthly self breast exams and yearly mammograms for women over the age of 54 is recommended.  Sunscreen of at least SPF 30 should be used on all sun-exposed parts of the skin when outside between the hours of 10 am and 4 pm (not just when at beach or pool, but even with exercise, golf, tennis, and yard work!)  Use a sunscreen that says "broad spectrum" so it covers both UVA and UVB rays, and make sure to reapply every 1-2 hours.  Remember to change the batteries in your smoke detectors when changing your clock times in the spring and fall. Carbon monoxide detectors are recommended for you home.  Use your seat belt every time you are in a car, and please drive safely and not be distracted with cell phones and texting while driving.

## 2020-03-05 NOTE — Progress Notes (Signed)
Chief Complaint  Patient presents with  . other    fasting CPE/ Has Ob and up to date on pap, never had  mammo. last labs were done a year ago. Both covid vaccine has been updated in chart    Kendra Molina is a 39 y.o. female who presents for a complete physical.  She hasn't been seen in this office since 03/2014.  She sees GYN yearly, and she does fasting labs yearly. She would like to have labs repeated today.  Cholesterol was a little higher last year.  She had COVID-19 infection in 07/2019. Mild course and she recovered completely.  Immunization History  Administered Date(s) Administered  . Influenza Split 06/05/2015  . Influenza-Unspecified 06/18/2016  . PFIZER SARS-COV-2 Vaccination 10/06/2019, 10/27/2019  . Tdap 09/22/2013   Gets flu shots yearly (required for work), last 05/2019. Last Pap smear: through GYN, UTD.  Sees her annually Last mammogram: never Last colonoscopy: never Last DEXA: never Dentist: twice yearly (usually, past due since switching dentists) Ophtho: goes yearly Exercise:  Some running (2x/week x 20-30 minutes).  Walks dog, bike with kids. Lifts 84 year old. Uses bands, handweights, ab roller.  Lipids: Lab Results  Component Value Date   CHOL 162 04/04/2014   HDL 67 04/04/2014   LDLCALC 84 04/04/2014   TRIG 57 04/04/2014   CHOLHDL 2.4 04/04/2014   She recalls that her cholesterol was "borderline" at GYN last year, and would like it repeated.   PMH, PSH, SH and FH were reviewed and updated today  Outpatient Encounter Medications as of 03/06/2020  Medication Sig  . Multiple Vitamin (MULTIVITAMIN) tablet Take 1 tablet by mouth daily.  . SPRINTEC 28 0.25-35 MG-MCG tablet SMARTSIG:1 Tablet(s) By Mouth  . [DISCONTINUED] CAMILA 0.35 MG tablet Take 1 tablet by mouth daily.  (Patient not taking: Reported on 03/06/2020)  . [DISCONTINUED] Prenatal Vit-Fe Fumarate-FA (MULTIVITAMIN-PRENATAL) 27-0.8 MG TABS tablet Take 1 tablet by mouth daily at 12 noon. (Patient  not taking: Reported on 03/06/2020)   No facility-administered encounter medications on file as of 03/06/2020.   No Known Allergies  ROS:  The patient denies anorexia, fever, headaches,  vision changes, decreased hearing, ear pain, sore throat, breast concerns, chest pain, palpitations, dizziness, syncope, dyspnea on exertion, cough, swelling, nausea, vomiting, diarrhea, constipation, abdominal pain, melena, hematochezia, indigestion/heartburn, hematuria, incontinence, dysuria, irregular menstrual cycles, vaginal discharge, odor or itch, genital lesions, joint pains, numbness, tingling, weakness, tremor, suspicious skin lesions, depression, anxiety, abnormal bleeding/bruising, or enlarged lymph nodes. Sees dermatologist routinely.   PHYSICAL EXAM:  BP 124/82   Pulse 71   Temp 98.5 F (36.9 C)   Ht 5\' 2"  (1.575 m)   Wt 117 lb 12.8 oz (53.4 kg)   LMP 02/22/2020   SpO2 98%   Breastfeeding No   BMI 21.55 kg/m   Wt Readings from Last 3 Encounters:  03/06/20 117 lb 12.8 oz (53.4 kg)  04/04/14 122 lb (55.3 kg)  12/07/13 153 lb (69.4 kg)   Repeat BP was higher (systolic 150), but had been discussing work  General Appearance:    Alert, cooperative, no distress, appears stated age  Head:    Normocephalic, without obvious abnormality, atraumatic  Eyes:    PERRL, conjunctiva/corneas clear, EOM's intact, fundi    benign  Ears:    Normal TM's and external ear canals  Nose:   Not examined, wearing mask due to COVID-19 pandemic  Throat:   Not examined, wearing mask due to COVID-19 pandemic  Neck:  Supple, no lymphadenopathy;  thyroid:  no enlargement/ tenderness/nodules; no carotid bruit or JVD  Back:    Spine nontender, no curvature, ROM normal, no CVA   tenderness  Lungs:     Clear to auscultation bilaterally without wheezes, rales or   ronchi; respirations unlabored  Chest Wall:    No tenderness or deformity   Heart:    Regular rate and rhythm, S1 and S2 normal, no murmur, rub   or  gallop  Breast Exam:    Deferred to GYN  Abdomen:     Soft, non-tender, nondistended, normoactive bowel sounds,    no masses, no hepatosplenomegaly  Genitalia:    Deferred to GYN     Extremities:   No clubbing, cyanosis or edema  Pulses:   2+ and symmetric all extremities  Skin:   Skin color, texture, turgor normal, no rashes or lesions.  Lymph nodes:   Cervical, supraclavicular, and axillary nodes normal  Neurologic:   Normal strength, sensation and gait; reflexes 2+ and symmetric throughout                               Psych:   Normal mood, affect, hygiene and grooming   ASSESSMENT/PLAN:  Annual physical exam - Plan: POCT Urinalysis DIP (Proadvantage Device), CBC with Differential/Platelet, Comprehensive metabolic panel, Lipid panel, VITAMIN D 25 Hydroxy (Vit-D Deficiency, Fractures), TSH  Fatigue, unspecified type - Plan: CBC with Differential/Platelet, Comprehensive metabolic panel, VITAMIN D 25 Hydroxy (Vit-D Deficiency, Fractures), TSH  Family history of early CAD - Plan: Lipid panel  BP higher than "normal" for her by nurse, but even higher when repeated by MD (after discussing CURES act, work Therapist, music).  She will have her BP checked at work to ensure it truly doesn't remain elevated.   Discussed monthly self breast exams and yearly mammograms after the age of 77; at least 30 minutes of aerobic activity at least 5 days/week, weight-bearing exercise 2x/wk; proper sunscreen use reviewed; healthy diet, including goals of calcium and vitamin D intake and alcohol recommendations (less than or equal to 1 drink/day) reviewed; regular seatbelt use; changing batteries in smoke detectors.  Immunization recommendations discussed--yearly flu shots are recommended. Colonoscopy recommendations reviewed-age 68, sooner if changes in family history or GI concerns develop  F/u 1 year, sooner prn based on lab results, or if elevated BP, or other concerns.

## 2020-03-06 ENCOUNTER — Encounter: Payer: Self-pay | Admitting: Family Medicine

## 2020-03-06 ENCOUNTER — Ambulatory Visit (INDEPENDENT_AMBULATORY_CARE_PROVIDER_SITE_OTHER): Payer: Federal, State, Local not specified - PPO | Admitting: Family Medicine

## 2020-03-06 ENCOUNTER — Other Ambulatory Visit: Payer: Self-pay

## 2020-03-06 VITALS — BP 124/82 | HR 71 | Temp 98.5°F | Ht 62.0 in | Wt 117.8 lb

## 2020-03-06 DIAGNOSIS — Z0000 Encounter for general adult medical examination without abnormal findings: Secondary | ICD-10-CM

## 2020-03-06 DIAGNOSIS — Z8249 Family history of ischemic heart disease and other diseases of the circulatory system: Secondary | ICD-10-CM

## 2020-03-06 DIAGNOSIS — I10 Essential (primary) hypertension: Secondary | ICD-10-CM | POA: Diagnosis not present

## 2020-03-06 DIAGNOSIS — R5383 Other fatigue: Secondary | ICD-10-CM

## 2020-03-06 DIAGNOSIS — E785 Hyperlipidemia, unspecified: Secondary | ICD-10-CM | POA: Diagnosis not present

## 2020-03-06 LAB — POCT URINALYSIS DIP (PROADVANTAGE DEVICE)
Bilirubin, UA: NEGATIVE
Blood, UA: NEGATIVE
Glucose, UA: NEGATIVE mg/dL
Ketones, POC UA: NEGATIVE mg/dL
Leukocytes, UA: NEGATIVE
Nitrite, UA: NEGATIVE
Protein Ur, POC: NEGATIVE mg/dL
Specific Gravity, Urine: 1.015
Urobilinogen, Ur: 0.2
pH, UA: 7.5 (ref 5.0–8.0)

## 2020-03-07 LAB — CBC WITH DIFFERENTIAL/PLATELET
Basophils Absolute: 0 10*3/uL (ref 0.0–0.2)
Basos: 1 %
EOS (ABSOLUTE): 0 10*3/uL (ref 0.0–0.4)
Eos: 1 %
Hematocrit: 38.4 % (ref 34.0–46.6)
Hemoglobin: 13.2 g/dL (ref 11.1–15.9)
Immature Grans (Abs): 0 10*3/uL (ref 0.0–0.1)
Immature Granulocytes: 0 %
Lymphocytes Absolute: 1.9 10*3/uL (ref 0.7–3.1)
Lymphs: 30 %
MCH: 32.1 pg (ref 26.6–33.0)
MCHC: 34.4 g/dL (ref 31.5–35.7)
MCV: 93 fL (ref 79–97)
Monocytes Absolute: 0.6 10*3/uL (ref 0.1–0.9)
Monocytes: 9 %
Neutrophils Absolute: 3.9 10*3/uL (ref 1.4–7.0)
Neutrophils: 59 %
Platelets: 193 10*3/uL (ref 150–450)
RBC: 4.11 x10E6/uL (ref 3.77–5.28)
RDW: 12.1 % (ref 11.7–15.4)
WBC: 6.4 10*3/uL (ref 3.4–10.8)

## 2020-03-07 LAB — COMPREHENSIVE METABOLIC PANEL
ALT: 12 IU/L (ref 0–32)
AST: 18 IU/L (ref 0–40)
Albumin/Globulin Ratio: 1.4 (ref 1.2–2.2)
Albumin: 4.2 g/dL (ref 3.8–4.8)
Alkaline Phosphatase: 54 IU/L (ref 48–121)
BUN/Creatinine Ratio: 13 (ref 9–23)
BUN: 9 mg/dL (ref 6–20)
Bilirubin Total: 0.3 mg/dL (ref 0.0–1.2)
CO2: 25 mmol/L (ref 20–29)
Calcium: 9.2 mg/dL (ref 8.7–10.2)
Chloride: 102 mmol/L (ref 96–106)
Creatinine, Ser: 0.71 mg/dL (ref 0.57–1.00)
GFR calc Af Amer: 125 mL/min/{1.73_m2} (ref >59)
GFR calc non Af Amer: 108 mL/min/{1.73_m2} (ref >59)
Globulin, Total: 2.9 g/dL (ref 1.5–4.5)
Glucose: 79 mg/dL (ref 65–99)
Potassium: 4.9 mmol/L (ref 3.5–5.2)
Sodium: 137 mmol/L (ref 134–144)
Total Protein: 7.1 g/dL (ref 6.0–8.5)

## 2020-03-07 LAB — TSH: TSH: 1.99 u[IU]/mL (ref 0.450–4.500)

## 2020-03-07 LAB — LIPID PANEL
Chol/HDL Ratio: 3 ratio (ref 0.0–4.4)
Cholesterol, Total: 222 mg/dL — ABNORMAL HIGH (ref 100–199)
HDL: 74 mg/dL (ref >39)
LDL Chol Calc (NIH): 121 mg/dL — ABNORMAL HIGH (ref 0–99)
Triglycerides: 154 mg/dL — ABNORMAL HIGH (ref 0–149)
VLDL Cholesterol Cal: 27 mg/dL (ref 5–40)

## 2020-03-07 LAB — VITAMIN D 25 HYDROXY (VIT D DEFICIENCY, FRACTURES): Vit D, 25-Hydroxy: 40.3 ng/mL (ref 30.0–100.0)

## 2020-03-12 ENCOUNTER — Telehealth: Payer: Self-pay | Admitting: Family Medicine

## 2020-03-12 NOTE — Telephone Encounter (Signed)
Received requested records received from [facility]

## 2020-03-19 ENCOUNTER — Encounter: Payer: Self-pay | Admitting: Family Medicine

## 2020-04-02 DIAGNOSIS — Z03818 Encounter for observation for suspected exposure to other biological agents ruled out: Secondary | ICD-10-CM | POA: Diagnosis not present

## 2020-04-02 DIAGNOSIS — Z20822 Contact with and (suspected) exposure to covid-19: Secondary | ICD-10-CM | POA: Diagnosis not present

## 2020-04-08 ENCOUNTER — Encounter: Payer: Self-pay | Admitting: Family Medicine

## 2020-04-09 NOTE — Progress Notes (Signed)
Chief Complaint  Patient presents with  . Hypertension    has been having some higher readings than normal. Has been having some dull headaches.    Patient presents to follow up on her blood pressure. It was elevated at her physical (though had been normal when checked by nurse).  She has been checking elsewhere and it remains elevated. She has been checking both at work and at home. BP's are running similarly at both locations.  BP's have been running 140-160/85-96.  Once saw 124/82 (7/16, prior to vacation). Didn't bring monitor on vacation, so all readings were taken while working.  She is having some dull headaches across her forehead, sometimes posteriorly.  This morning she woke up with frontal headache. Relieve by ibuprofen, uses periodically. She denies any allergy symptoms (sniffling, sneezing, PND, cough).  She is on OCP's per GYN.  She has a visit with GYN scheduled in September.  She recalls BP was normal at GYN visits, so doesn't think it is her birth control that is raising her blood pressure.  Work has been stressful (COVID, staffing issues). Other stressor was in April, when her 64 month old nephew had near-drowning accident in [place].  The acute stress is over, it will be a long recovery for him, and this weighs on her some, but not really worrying about this now like she did initially.  Has been on two vacations since last visit. She has been more careful with her diet when at home--cutting back on cholesterol in her diet after last labs. Admits that her weakness is french fries.  May have some sodium in some of the frozen foods they cook at home.  Lab Results  Component Value Date   CHOL 222 (H) 03/06/2020   HDL 74 03/06/2020   LDLCALC 121 (H) 03/06/2020   TRIG 154 (H) 03/06/2020   CHOLHDL 3.0 03/06/2020     PMH, PSH, SH reviewed  Outpatient Encounter Medications as of 04/10/2020  Medication Sig  . Multiple Vitamin (MULTIVITAMIN) tablet Take 1 tablet by mouth daily.   . Omega-3 Fatty Acids (FISH OIL) 1200 MG CAPS Take 1 capsule by mouth daily.  . SPRINTEC 28 0.25-35 MG-MCG tablet SMARTSIG:1 Tablet(s) By Mouth   No facility-administered encounter medications on file as of 04/10/2020.   No Known Allergies  ROS: no fever, chills, URI symptoms, cough, shortness of breath, chest pain.  Occasional frontal headaches per HPI. No GI or GU complaints. No bleeding, bruising, rash.  Moods are good. Some work stress   PHYSICAL EXAM:  BP (!) 150/90   Pulse 68   Ht 5\' 2"  (1.575 m)   Wt 121 lb (54.9 kg)   LMP 03/24/2020 (Approximate)   BMI 22.13 kg/m   Wt Readings from Last 3 Encounters:  04/10/20 121 lb (54.9 kg)  03/06/20 117 lb 12.8 oz (53.4 kg)  04/04/14 122 lb (55.3 kg)   Pleasant, well-appearing female in good spirits. She does not appear anxious or stressed HEENT: conjunctiva and sclera are clear, EOMI. Nasal mucosa is only mildly edematous, no drainage noted.  Sinuses and temporalis muscles are nontender Neck: no lymphadenopathy, thyromegaly or mass, no bruit Heart: regular rate and rhythm, no murmur Lungs: clear bilaterally Extremities: no edema Neuro: alert and oriented, normal strength, gait  EKG: NSR rate 68.  Borderline LAE, otherwise normal.    ASSESSMENT/PLAN:  Elevated blood pressure reading  Family history of early CAD  Hyperlipidemia, unspecified hyperlipidemia type - reviewed lipids--LDL was fine, TG only slightly high. Pt  reassured.  fish oil and cutting back on french fries will help  >30 min FTF, more than 1/2 spent counseling, plus additional time in chart review and documentation.  Patient would like dietary trial before starting meds. Reviewed low sodium diet. Reviewed her lipids--only slightly out of range--advised for her not to worry or stress about that too much (cutting back on french fries and taking fish oil should take care of it), and instead focus on sodium. She will continue to monitor regularly at home. She  will send in BP's if persistently elevated after getting back into a regular routine once kids start back at school.  We discussed ACEI/ARBs and diuretics in detail. Likely would start 25mg  HCTZ, to start at 1/2 tablet and increase after 2 weeks if BP remains above goal. We reviewed dietary potassium intake, and the need to f/u 4-6 weeks after starting meds.  EKG was performed today--to r/o LVH or other abnl (which would warrant further treatment/evaluation sooner).  This was normal.

## 2020-04-10 ENCOUNTER — Encounter: Payer: Self-pay | Admitting: Family Medicine

## 2020-04-10 ENCOUNTER — Other Ambulatory Visit: Payer: Self-pay

## 2020-04-10 ENCOUNTER — Ambulatory Visit: Payer: Federal, State, Local not specified - PPO | Admitting: Family Medicine

## 2020-04-10 VITALS — BP 150/90 | HR 68 | Ht 62.0 in | Wt 121.0 lb

## 2020-04-10 DIAGNOSIS — R030 Elevated blood-pressure reading, without diagnosis of hypertension: Secondary | ICD-10-CM

## 2020-04-10 DIAGNOSIS — E785 Hyperlipidemia, unspecified: Secondary | ICD-10-CM | POA: Diagnosis not present

## 2020-04-10 DIAGNOSIS — Z8249 Family history of ischemic heart disease and other diseases of the circulatory system: Secondary | ICD-10-CM | POA: Diagnosis not present

## 2020-04-10 NOTE — Patient Instructions (Addendum)
Continue to monitor your blood pressure--use the sheet provided, and feel free to send me your numbers in the next few weeks or with any concerns. Cut back on the sodium in your diet. Continue regular exercise and stress-reduction techniques.  See below for some information that might be helpful (sorry if they are duplicates!)  If after you're in a good routine with the kids back to school, your blood pressures remain over 140/90 (ideally we would like to see them under 130/80), then we should start medications.   We discussed HCTZ 25mg  tablets (starting at a 1/2 pill, and increasing if not getting to goal, and getting potassium-rich foods such as bananas), and also ARB's such as losartan.    If we start medications, then I'd like to see you back 4-6 weeks after starting them. Blood test will be needed with either of those medications, but you do not need to fast.   DASH Eating Plan DASH stands for "Dietary Approaches to Stop Hypertension." The DASH eating plan is a healthy eating plan that has been shown to reduce high blood pressure (hypertension). It may also reduce your risk for type 2 diabetes, heart disease, and stroke. The DASH eating plan may also help with weight loss. What are tips for following this plan?  General guidelines  Avoid eating more than 2,300 mg (milligrams) of salt (sodium) a day. If you have hypertension, you may need to reduce your sodium intake to 1,500 mg a day.  Limit alcohol intake to no more than 1 drink a day for nonpregnant women and 2 drinks a day for men. One drink equals 12 oz of beer, 5 oz of wine, or 1 oz of hard liquor.  Work with your health care provider to maintain a healthy body weight or to lose weight. Ask what an ideal weight is for you.  Get at least 30 minutes of exercise that causes your heart to beat faster (aerobic exercise) most days of the week. Activities may include walking, swimming, or biking.  Work with your health care provider or  diet and nutrition specialist (dietitian) to adjust your eating plan to your individual calorie needs. Reading food labels   Check food labels for the amount of sodium per serving. Choose foods with less than 5 percent of the Daily Value of sodium. Generally, foods with less than 300 mg of sodium per serving fit into this eating plan.  To find whole grains, look for the word "whole" as the first word in the ingredient list. Shopping  Buy products labeled as "low-sodium" or "no salt added."  Buy fresh foods. Avoid canned foods and premade or frozen meals. Cooking  Avoid adding salt when cooking. Use salt-free seasonings or herbs instead of table salt or sea salt. Check with your health care provider or pharmacist before using salt substitutes.  Do not fry foods. Cook foods using healthy methods such as baking, boiling, grilling, and broiling instead.  Cook with heart-healthy oils, such as olive, canola, soybean, or sunflower oil. Meal planning  Eat a balanced diet that includes: ? 5 or more servings of fruits and vegetables each day. At each meal, try to fill half of your plate with fruits and vegetables. ? Up to 6-8 servings of whole grains each day. ? Less than 6 oz of lean meat, poultry, or fish each day. A 3-oz serving of meat is about the same size as a deck of cards. One egg equals 1 oz. ? 2 servings of low-fat dairy  each day. ? A serving of nuts, seeds, or beans 5 times each week. ? Heart-healthy fats. Healthy fats called Omega-3 fatty acids are found in foods such as flaxseeds and coldwater fish, like sardines, salmon, and mackerel.  Limit how much you eat of the following: ? Canned or prepackaged foods. ? Food that is high in trans fat, such as fried foods. ? Food that is high in saturated fat, such as fatty meat. ? Sweets, desserts, sugary drinks, and other foods with added sugar. ? Full-fat dairy products.  Do not salt foods before eating.  Try to eat at least 2  vegetarian meals each week.  Eat more home-cooked food and less restaurant, buffet, and fast food.  When eating at a restaurant, ask that your food be prepared with less salt or no salt, if possible. What foods are recommended? The items listed may not be a complete list. Talk with your dietitian about what dietary choices are best for you. Grains Whole-grain or whole-wheat bread. Whole-grain or whole-wheat pasta. Brown rice. Orpah Cobb. Bulgur. Whole-grain and low-sodium cereals. Pita bread. Low-fat, low-sodium crackers. Whole-wheat flour tortillas. Vegetables Fresh or frozen vegetables (raw, steamed, roasted, or grilled). Low-sodium or reduced-sodium tomato and vegetable juice. Low-sodium or reduced-sodium tomato sauce and tomato paste. Low-sodium or reduced-sodium canned vegetables. Fruits All fresh, dried, or frozen fruit. Canned fruit in natural juice (without added sugar). Meat and other protein foods Skinless chicken or [place]. Ground chicken or [place]. Pork with fat trimmed off. Fish and seafood. Egg whites. Dried beans, peas, or lentils. Unsalted nuts, nut butters, and seeds. Unsalted canned beans. Lean cuts of beef with fat trimmed off. Low-sodium, lean deli meat. Dairy Low-fat (1%) or fat-free (skim) milk. Fat-free, low-fat, or reduced-fat cheeses. Nonfat, low-sodium ricotta or cottage cheese. Low-fat or nonfat yogurt. Low-fat, low-sodium cheese. Fats and oils Soft margarine without trans fats. Vegetable oil. Low-fat, reduced-fat, or light mayonnaise and salad dressings (reduced-sodium). Canola, safflower, olive, soybean, and sunflower oils. Avocado. Seasoning and other foods Herbs. Spices. Seasoning mixes without salt. Unsalted popcorn and pretzels. Fat-free sweets. What foods are not recommended? The items listed may not be a complete list. Talk with your dietitian about what dietary choices are best for you. Grains Baked goods made with fat, such as croissants, muffins, or  some breads. Dry pasta or rice meal packs. Vegetables Creamed or fried vegetables. Vegetables in a cheese sauce. Regular canned vegetables (not low-sodium or reduced-sodium). Regular canned tomato sauce and paste (not low-sodium or reduced-sodium). Regular tomato and vegetable juice (not low-sodium or reduced-sodium). Rosita Fire. Olives. Fruits Canned fruit in a light or heavy syrup. Fried fruit. Fruit in cream or butter sauce. Meat and other protein foods Fatty cuts of meat. Ribs. Fried meat. Tomasa Blase. Sausage. Bologna and other processed lunch meats. Salami. Fatback. Hotdogs. Bratwurst. Salted nuts and seeds. Canned beans with added salt. Canned or smoked fish. Whole eggs or egg yolks. Chicken or [place] with skin. Dairy Whole or 2% milk, cream, and half-and-half. Whole or full-fat cream cheese. Whole-fat or sweetened yogurt. Full-fat cheese. Nondairy creamers. Whipped toppings. Processed cheese and cheese spreads. Fats and oils Butter. Stick margarine. Lard. Shortening. Ghee. Bacon fat. Tropical oils, such as coconut, palm kernel, or palm oil. Seasoning and other foods Salted popcorn and pretzels. Onion salt, garlic salt, seasoned salt, table salt, and sea salt. Worcestershire sauce. Tartar sauce. Barbecue sauce. Teriyaki sauce. Soy sauce, including reduced-sodium. Steak sauce. Canned and packaged gravies. Fish sauce. Oyster sauce. Cocktail sauce. Horseradish that you find on  the shelf. Ketchup. Mustard. Meat flavorings and tenderizers. Bouillon cubes. Hot sauce and Tabasco sauce. Premade or packaged marinades. Premade or packaged taco seasonings. Relishes. Regular salad dressings. Where to find more information:  National Heart, Lung, and Blood Institute: PopSteam.iswww.nhlbi.nih.gov  American Heart Association: www.heart.org Summary  The DASH eating plan is a healthy eating plan that has been shown to reduce high blood pressure (hypertension). It may also reduce your risk for type 2 diabetes, heart disease,  and stroke.  With the DASH eating plan, you should limit salt (sodium) intake to 2,300 mg a day. If you have hypertension, you may need to reduce your sodium intake to 1,500 mg a day.  When on the DASH eating plan, aim to eat more fresh fruits and vegetables, whole grains, lean proteins, low-fat dairy, and heart-healthy fats.  Work with your health care provider or diet and nutrition specialist (dietitian) to adjust your eating plan to your individual calorie needs. This information is not intended to replace advice given to you by your health care provider. Make sure you discuss any questions you have with your health care provider. Document Revised: 07/23/2017 Document Reviewed: 08/03/2016 Elsevier Patient Education  2020 Else[employer] Inc.   Mindfulness-Based Stress Reduction Mindfulness-based stress reduction (MBSR) is a program that helps people learn to practice mindfulness. Mindfulness is the practice of intentionally paying attention to the present moment. It can be learned and practiced through techniques such as education, breathing exercises, meditation, and yoga. MBSR includes several mindfulness techniques in one program. MBSR works best when you understand the treatment, are willing to try new things, and can commit to spending time practicing what you learn. MBSR training may include learning about:  How your emotions, thoughts, and reactions affect your body.  New ways to respond to things that cause negative thoughts to start (triggers).  How to notice your thoughts and let go of them.  Practicing awareness of everyday things that you normally do without thinking.  The techniques and goals of different types of meditation. What are the benefits of MBSR? MBSR can have many benefits, which include helping you to:  Develop self-awareness. This refers to knowing and understanding yourself.  Learn skills and attitudes that help you to participate in your own health  care.  Learn new ways to care for yourself.  Be more accepting about how things are, and let things go.  Be less judgmental and approach things with an open mind.  Be patient with yourself and trust yourself more. MBSR has also been shown to:  Reduce negative emotions, such as depression and anxiety.  Improve memory and focus.  Change how you sense and approach pain.  Boost your body's ability to fight infections.  Help you connect better with other people.  Improve your sense of well-being. Follow these instructions at home:   Find a local in-person or online MBSR program.  Set aside some time regularly for mindfulness practice.  Find a mindfulness practice that works best for you. This may include one or more of the following: ? Meditation. Meditation involves focusing your mind on a certain thought or activity. ? Breathing awareness exercises. These help you to stay present by focusing on your breath. ? Body scan. For this practice, you lie down and pay attention to each part of your body from head to toe. You can identify tension and soreness and intentionally relax parts of your body. ? Yoga. Yoga involves stretching and breathing, and it can improve your ability to move  and be flexible. It can also provide an experience of testing your body's limits, which can help you release stress. ? Mindful eating. This way of eating involves focusing on the taste, texture, color, and smell of each bite of food. Because this slows down eating and helps you feel full sooner, it can be an important part of a weight-loss plan.  Find a podcast or recording that provides guidance for breathing awareness, body scan, or meditation exercises. You can listen to these any time when you have a free moment to rest without distractions.  Follow your treatment plan as told by your health care provider. This may include taking regular medicines and making changes to your diet or lifestyle as  recommended. How to practice mindfulness To do a basic awareness exercise:  Find a comfortable place to sit.  Pay attention to the present moment. Observe your thoughts, feelings, and surroundings just as they are.  Avoid placing judgment on yourself, your feelings, or your surroundings. Make note of any judgment that comes up, and let it go.  Your mind may wander, and that is okay. Make note of when your thoughts drift, and return your attention to the present moment. To do basic mindfulness meditation:  Find a comfortable place to sit. This may include a stable chair or a firm floor cushion. ? Sit upright with your back straight. Let your arms fall next to your side with your hands resting on your legs. ? If sitting in a chair, rest your feet flat on the floor. ? If sitting on a cushion, cross your legs in front of you.  Keep your head in a neutral position with your chin dropped slightly. Relax your jaw and rest the tip of your tongue on the roof of your mouth. Drop your gaze to the floor. You can close your eyes if you like.  Breathe normally and pay attention to your breath. Feel the air moving in and out of your nose. Feel your belly expanding and relaxing with each breath.  Your mind may wander, and that is okay. Make note of when your thoughts drift, and return your attention to your breath.  Avoid placing judgment on yourself, your feelings, or your surroundings. Make note of any judgment or feelings that come up, let them go, and bring your attention back to your breath.  When you are ready, lift your gaze or open your eyes. Pay attention to how your body feels after the meditation. Where to find more information You can find more information about MBSR from:  Your health care provider.  Community-based meditation centers or programs.  Programs offered near you. Summary  Mindfulness-based stress reduction (MBSR) is a program that teaches you how to intentionally pay  attention to the present moment. It is used with other treatments to help you cope better with daily stress, emotions, and pain.  MBSR focuses on developing self-awareness, which allows you to respond to life stress without judgment or negative emotions.  MBSR programs may involve learning different mindfulness practices, such as breathing exercises, meditation, yoga, body scan, or mindful eating. Find a mindfulness practice that works best for you, and set aside time for it on a regular basis. This information is not intended to replace advice given to you by your health care provider. Make sure you discuss any questions you have with your health care provider. Document Revised: 07/23/2017 Document Reviewed: 12/17/2016 Elsevier Patient Education  2020 [employer].

## 2020-04-17 ENCOUNTER — Encounter: Payer: Self-pay | Admitting: Family Medicine

## 2020-04-30 DIAGNOSIS — Z01419 Encounter for gynecological examination (general) (routine) without abnormal findings: Secondary | ICD-10-CM | POA: Diagnosis not present

## 2020-04-30 DIAGNOSIS — Z1151 Encounter for screening for human papillomavirus (HPV): Secondary | ICD-10-CM | POA: Diagnosis not present

## 2020-04-30 DIAGNOSIS — Z6822 Body mass index (BMI) 22.0-22.9, adult: Secondary | ICD-10-CM | POA: Diagnosis not present

## 2020-04-30 LAB — HM PAP SMEAR
HM Pap smear: NORMAL
HPV, high-risk: NEGATIVE

## 2020-04-30 LAB — RESULTS CONSOLE HPV: CHL HPV: NEGATIVE

## 2020-08-07 DIAGNOSIS — Z20822 Contact with and (suspected) exposure to covid-19: Secondary | ICD-10-CM | POA: Diagnosis not present

## 2020-08-09 ENCOUNTER — Encounter: Payer: Self-pay | Admitting: Family Medicine

## 2020-09-18 ENCOUNTER — Encounter: Payer: Federal, State, Local not specified - PPO | Admitting: Family Medicine

## 2020-09-26 DIAGNOSIS — D485 Neoplasm of uncertain behavior of skin: Secondary | ICD-10-CM | POA: Diagnosis not present

## 2020-09-26 DIAGNOSIS — L821 Other seborrheic keratosis: Secondary | ICD-10-CM | POA: Diagnosis not present

## 2020-09-26 DIAGNOSIS — L814 Other melanin hyperpigmentation: Secondary | ICD-10-CM | POA: Diagnosis not present

## 2020-09-26 DIAGNOSIS — Z1231 Encounter for screening mammogram for malignant neoplasm of breast: Secondary | ICD-10-CM | POA: Diagnosis not present

## 2020-09-26 DIAGNOSIS — D225 Melanocytic nevi of trunk: Secondary | ICD-10-CM | POA: Diagnosis not present

## 2020-09-26 LAB — HM MAMMOGRAPHY

## 2020-09-27 DIAGNOSIS — D225 Melanocytic nevi of trunk: Secondary | ICD-10-CM | POA: Diagnosis not present

## 2020-09-30 ENCOUNTER — Encounter: Payer: Self-pay | Admitting: *Deleted

## 2020-10-04 ENCOUNTER — Encounter: Payer: Self-pay | Admitting: Family Medicine

## 2020-12-01 DIAGNOSIS — R52 Pain, unspecified: Secondary | ICD-10-CM | POA: Diagnosis not present

## 2020-12-01 DIAGNOSIS — R059 Cough, unspecified: Secondary | ICD-10-CM | POA: Diagnosis not present

## 2020-12-01 DIAGNOSIS — R0981 Nasal congestion: Secondary | ICD-10-CM | POA: Diagnosis not present

## 2020-12-01 DIAGNOSIS — U071 COVID-19: Secondary | ICD-10-CM | POA: Diagnosis not present

## 2021-03-11 ENCOUNTER — Encounter: Payer: Self-pay | Admitting: Family Medicine

## 2021-03-11 NOTE — Progress Notes (Signed)
Chief Complaint  Patient presents with   Annual Exam    Fasting annual exam no pap. Has eye appt next week. No concerns.     Kendra Molina is a 40 y.o. female who presents for a complete physical.    She had COVID-19 infection in 07/2019, and again in 11/2020. She recovered completely.  Her GYN switched her birth control to a progesterone-only pill last year, due to her higher blood pressures.  BP has been fine since. She denies any spotting or side effects.  Immunization History  Administered Date(s) Administered   Influenza Split 06/05/2015   Influenza-Unspecified 06/18/2016, 05/24/2020   PFIZER(Purple Top)SARS-COV-2 Vaccination 10/05/2019, 10/27/2019, 06/11/2020   Tdap 09/22/2013   Gets flu shots yearly (required for work) Last Pap smear: 02/2017, through GYN, UTD.  Sees her annually Last mammogram: 09/2020 Last colonoscopy: never Last DEXA: never Dentist: twice yearly Ophtho: goes yearly Exercise:  Walks 2 miles in the mornings 3x/week. Walks dog when cooler, bike with kids. Uses bands, handweights, ab roller once a week  Lipids: Lab Results  Component Value Date   CHOL 222 (H) 03/06/2020   HDL 74 03/06/2020   LDLCALC 121 (H) 03/06/2020   TRIG 154 (H) 03/06/2020   CHOLHDL 3.0 03/06/2020   Vitamin D-OH 40.3 in 02/2020 Normal TSH, CBC and c-met in 02/2020  PMH, PSH, SH and FH were reviewed and updated today, no changes.  Outpatient Encounter Medications as of 03/12/2021  Medication Sig   Multiple Vitamin (MULTIVITAMIN) tablet Take 1 tablet by mouth daily.   SLYND 4 MG TABS Take 1 tablet by mouth daily.   Omega-3 Fatty Acids (FISH OIL) 1200 MG CAPS Take 1 capsule by mouth daily. (Patient not taking: Reported on 03/12/2021)   [DISCONTINUED] SPRINTEC 28 0.25-35 MG-MCG tablet SMARTSIG:1 Tablet(s) By Mouth   No facility-administered encounter medications on file as of 03/12/2021.   No Known Allergies   ROS:  The patient denies anorexia, fever, headaches,  vision changes,  decreased hearing, ear pain, sore throat, breast concerns, chest pain, palpitations, dizziness, syncope, dyspnea on exertion, cough, swelling, nausea, vomiting, diarrhea, constipation, abdominal pain, melena, hematochezia, indigestion/heartburn, hematuria, incontinence, dysuria, irregular menstrual cycles, vaginal discharge, odor or itch, genital lesions, joint pains, numbness, tingling, weakness, tremor, suspicious skin lesions, depression, anxiety, abnormal bleeding/bruising, or enlarged lymph nodes. Sees dermatologist routinely.   PHYSICAL EXAM:  BP 120/70   Pulse 72   Ht 5' 2"  (1.575 m)   Wt 120 lb 3.2 oz (54.5 kg)   BMI 21.98 kg/m   Wt Readings from Last 3 Encounters:  03/12/21 120 lb 3.2 oz (54.5 kg)  04/10/20 121 lb (54.9 kg)  03/06/20 117 lb 12.8 oz (53.4 kg)    General Appearance:    Alert, cooperative, no distress, appears stated age  Head:    Normocephalic, without obvious abnormality, atraumatic  Eyes:    PERRL, conjunctiva/corneas clear, EOM's intact, fundi    benign  Ears:    Normal TM's and external ear canals  Nose:   Not examined, wearing mask due to COVID-19 pandemic  Throat:   Not examined, wearing mask due to COVID-19 pandemic  Neck:   Supple, no lymphadenopathy;  thyroid:  no enlargement/ tenderness/nodules; no carotid bruit or JVD  Back:    Spine nontender, no curvature, ROM normal, no CVA   tenderness  Lungs:     Clear to auscultation bilaterally without wheezes, rales or ronchi; respirations unlabored  Chest Wall:    No tenderness or deformity  Heart:    Regular rate and rhythm, S1 and S2 normal, no murmur, rub or gallop  Breast Exam:    Deferred to GYN  Abdomen:     Soft, non-tender, nondistended, normoactive bowel sounds,    no masses, no hepatosplenomegaly  Genitalia:    Deferred to GYN       Extremities:   No clubbing, cyanosis or edema  Pulses:   2+ and symmetric all extremities  Skin:   Skin color, texture, turgor normal, no rashes or lesions noted  (exam limited, not changed into gown).  Lymph nodes:   Cervical, supraclavicular, and axillary nodes normal  Neurologic:   Normal strength, sensation and gait; reflexes 2+ and symmetric throughout                               Psych:   Normal mood, affect, hygiene and grooming  Urine dip normal.   ASSESSMENT/PLAN:  Annual physical exam - Plan: POCT Urinalysis DIP (Proadvantage Device), Lipid panel  Hyperlipidemia, unspecified hyperlipidemia type - reviewed low cholesterol dite - Plan: Lipid panel   Discussed monthly self breast exams and yearly mammograms (had first earlier this year); at least 30 minutes of aerobic activity at least 5 days/week, weight-bearing exercise 2x/wk; proper sunscreen use reviewed; healthy diet, including goals of calcium and vitamin D intake and alcohol recommendations (less than or equal to 1 drink/day) reviewed; regular seatbelt use; changing batteries in smoke detectors.  Immunization recommendations discussed--yearly flu shots are recommended. Colonoscopy recommendations reviewed-age 38, sooner if changes in family history or GI concerns develop  F/u 1 year, sooner prn based on lab results, or if elevated BP, or other concerns.

## 2021-03-11 NOTE — Patient Instructions (Addendum)
HEALTH MAINTENANCE RECOMMENDATIONS:  It is recommended that you get at least 30 minutes of aerobic exercise at least 5 days/week (for weight loss, you may need as much as 60-90 minutes). This can be any activity that gets your heart rate up. This can be divided in 10-15 minute intervals if needed, but try and build up your endurance at least once a week.  Weight bearing exercise is also recommended twice weekly.  Eat a healthy diet with lots of vegetables, fruits and fiber.  "Colorful" foods have a lot of vitamins (ie green vegetables, tomatoes, red peppers, etc).  Limit sweet tea, regular sodas and alcoholic beverages, all of which has a lot of calories and sugar.  Up to 1 alcoholic drink daily may be beneficial for women (unless trying to lose weight, watch sugars).  Drink a lot of water.  Calcium recommendations are 1200-1500 mg daily (1500 mg for postmenopausal women or women without ovaries), and vitamin D 1000 IU daily.  This should be obtained from diet and/or supplements (vitamins), and calcium should not be taken all at once, but in divided doses.  Monthly self breast exams and yearly mammograms for women over the age of 60 is recommended.  Sunscreen of at least SPF 30 should be used on all sun-exposed parts of the skin when outside between the hours of 10 am and 4 pm (not just when at beach or pool, but even with exercise, golf, tennis, and yard work!)  Use a sunscreen that says "broad spectrum" so it covers both UVA and UVB rays, and make sure to reapply every 1-2 hours.  Remember to change the batteries in your smoke detectors when changing your clock times in the spring and fall. Carbon monoxide detectors are recommended for your home.  Use your seat belt every time you are in a car, and please drive safely and not be distracted with cell phones and texting while driving.  Calcium Content in Foods Calcium is the most abundant mineral in the body. Most of the body's calcium supply is  stored in bones and teeth. Calcium helps many parts of the body function normally, including: Blood and blood vessels. Nerves. Hormones. Muscles. Bones and teeth. When your calcium stores are low, you may be at risk for low bone mass, bone loss, and broken bones (fractures). When you get enough calcium, it helps to support strong bones and teeththroughout your life. Calcium is especially important for: Children during growth spurts. Girls during adolescence. Women who are pregnant or breastfeeding. Women after their menstrual cycle stops (postmenopause). Women whose menstrual cycle has stopped due to anorexia nervosa or regular intense exercise. People who cannot eat or digest dairy products. Vegans. Recommended daily amounts of calcium: Women (ages 33 to 2): 1,000 mg per day. Women (ages 67 and older): 1,200 mg per day. Men (ages 20 to 55): 1,000 mg per day. Men (ages 39 and older): 1,200 mg per day. Women (ages 70 to 83): 1,300 mg per day. Men (ages 52 to 71): 1,300 mg per day. General information Eat foods that are high in calcium. Try to get most of your calcium from food. Some people may benefit from taking calcium supplements. Check with your health care provider or diet and nutrition specialist (dietitian) before starting any calcium supplements. Calcium supplements may interact with certain medicines. Too much calcium may cause other health problems, such as constipation and kidney stones. For the body to absorb calcium, it needs vitamin D. Sources of vitamin D include: Skin  exposure to direct sunlight. Foods, such as egg yolks, liver, mushrooms, saltwater fish, and fortified milk. Vitamin D supplements. Check with your health care provider or dietitian before starting any vitamin D supplements. What foods are high in calcium?  Foods that are high in calcium contain more than 100 milligrams per serving. Fruits Fortified orange juice or other fruit juice, 300 mg per 8 oz  serving. Vegetables Collard greens, 360 mg per 8 oz serving. Kale, 100 mg per 8 oz serving. Bok choy, 160 mg per 8 oz serving. Grains Fortified ready-to-eat cereals, 100 to 1,000 mg per 8 oz serving. Fortified frozen waffles, 200 mg in 2 waffles. Oatmeal, 140 mg in 1 cup. Meats and other proteins Sardines, canned with bones, 325 mg per 3 oz serving. Salmon, canned with bones, 180 mg per 3 oz serving. Canned shrimp, 125 mg per 3 oz serving. Baked beans, 160 mg per 4 oz serving. Tofu, firm, made with calcium sulfate, 253 mg per 4 oz serving. Dairy Yogurt, plain, low-fat, 310 mg per 6 oz serving. Nonfat milk, 300 mg per 8 oz serving. American cheese, 195 mg per 1 oz serving. Cheddar cheese, 205 mg per 1 oz serving. Cottage cheese 2%, 105 mg per 4 oz serving. Fortified soy, rice, or almond milk, 300 mg per 8 oz serving. Mozzarella, part skim, 210 mg per 1 oz serving. The items listed above may not be a complete list of foods high in calcium. Actual amounts of calcium may be different depending on processing. Contact a dietitian for more information. What foods are lower in calcium? Foods that are lower in calcium contain 50 mg or less per serving. Fruits Apple, about 6 mg. Banana, about 12 mg. Vegetables Lettuce, 19 mg per 2 oz serving. Tomato, about 11 mg. Grains Rice, 4 mg per 6 oz serving. Boiled potatoes, 14 mg per 8 oz serving. [place] bread, 6 mg per slice. Meats and other proteins Egg, 27 mg per 2 oz serving. Red meat, 7 mg per 4 oz serving. Chicken, 17 mg per 4 oz serving. Fish, cod, or trout, 20 mg per 4 oz serving. Dairy Cream cheese, regular, 14 mg per 1 Tbsp serving. Brie cheese, 50 mg per 1 oz serving. Parmesan cheese, 70 mg per 1 Tbsp serving. The items listed above may not be a complete list of foods lower in calcium. Actual amounts of calcium may be different depending on processing. Contact a dietitian for more information. Summary Calcium is an important  mineral in the body because it affects many functions. Getting enough calcium helps support strong bones and teeth throughout your life. Try to get most of your calcium from food. Calcium supplements may interact with certain medicines. Check with your health care provider or dietitian before starting any calcium supplements. This information is not intended to replace advice given to you by your health care provider. Make sure you discuss any questions you have with your healthcare provider. Document Revised: 12/06/2019 Document Reviewed: 12/06/2019 Elsevier Patient Education  2022 [employer].

## 2021-03-12 ENCOUNTER — Other Ambulatory Visit: Payer: Self-pay

## 2021-03-12 ENCOUNTER — Ambulatory Visit (INDEPENDENT_AMBULATORY_CARE_PROVIDER_SITE_OTHER): Payer: Federal, State, Local not specified - PPO | Admitting: Family Medicine

## 2021-03-12 ENCOUNTER — Encounter: Payer: Self-pay | Admitting: Family Medicine

## 2021-03-12 VITALS — BP 120/70 | HR 72 | Ht 62.0 in | Wt 120.2 lb

## 2021-03-12 DIAGNOSIS — Z0000 Encounter for general adult medical examination without abnormal findings: Secondary | ICD-10-CM

## 2021-03-12 DIAGNOSIS — E785 Hyperlipidemia, unspecified: Secondary | ICD-10-CM

## 2021-03-12 LAB — POCT URINALYSIS DIP (PROADVANTAGE DEVICE)
Bilirubin, UA: NEGATIVE
Blood, UA: NEGATIVE
Glucose, UA: NEGATIVE mg/dL
Ketones, POC UA: NEGATIVE mg/dL
Leukocytes, UA: NEGATIVE
Nitrite, UA: NEGATIVE
Protein Ur, POC: NEGATIVE mg/dL
Specific Gravity, Urine: 1.015
Urobilinogen, Ur: NEGATIVE
pH, UA: 7.5 (ref 5.0–8.0)

## 2021-03-13 LAB — LIPID PANEL
Chol/HDL Ratio: 3.1 ratio (ref 0.0–4.4)
Cholesterol, Total: 194 mg/dL (ref 100–199)
HDL: 63 mg/dL (ref >39)
LDL Chol Calc (NIH): 114 mg/dL — ABNORMAL HIGH (ref 0–99)
Triglycerides: 93 mg/dL (ref 0–149)
VLDL Cholesterol Cal: 17 mg/dL (ref 5–40)

## 2021-05-05 DIAGNOSIS — Z6822 Body mass index (BMI) 22.0-22.9, adult: Secondary | ICD-10-CM | POA: Diagnosis not present

## 2021-05-05 DIAGNOSIS — Z01419 Encounter for gynecological examination (general) (routine) without abnormal findings: Secondary | ICD-10-CM | POA: Diagnosis not present

## 2021-05-05 DIAGNOSIS — Z3041 Encounter for surveillance of contraceptive pills: Secondary | ICD-10-CM | POA: Diagnosis not present

## 2021-10-02 DIAGNOSIS — Z1231 Encounter for screening mammogram for malignant neoplasm of breast: Secondary | ICD-10-CM | POA: Diagnosis not present

## 2021-10-02 LAB — HM MAMMOGRAPHY

## 2021-10-30 ENCOUNTER — Encounter: Payer: Self-pay | Admitting: Family Medicine

## 2021-11-12 DIAGNOSIS — L814 Other melanin hyperpigmentation: Secondary | ICD-10-CM | POA: Diagnosis not present

## 2021-11-12 DIAGNOSIS — D225 Melanocytic nevi of trunk: Secondary | ICD-10-CM | POA: Diagnosis not present

## 2021-11-12 DIAGNOSIS — L821 Other seborrheic keratosis: Secondary | ICD-10-CM | POA: Diagnosis not present

## 2021-11-12 DIAGNOSIS — L988 Other specified disorders of the skin and subcutaneous tissue: Secondary | ICD-10-CM | POA: Diagnosis not present

## 2022-03-24 ENCOUNTER — Encounter: Payer: Self-pay | Admitting: Family Medicine

## 2022-03-24 NOTE — Patient Instructions (Incomplete)
  HEALTH MAINTENANCE RECOMMENDATIONS:  It is recommended that you get at least 30 minutes of aerobic exercise at least 5 days/week (for weight loss, you may need as much as 60-90 minutes). This can be any activity that gets your heart rate up. This can be divided in 10-15 minute intervals if needed, but try and build up your endurance at least once a week.  Weight bearing exercise is also recommended twice weekly.  Eat a healthy diet with lots of vegetables, fruits and fiber.  "Colorful" foods have a lot of vitamins (ie green vegetables, tomatoes, red peppers, etc).  Limit sweet tea, regular sodas and alcoholic beverages, all of which has a lot of calories and sugar.  Up to 1 alcoholic drink daily may be beneficial for women (unless trying to lose weight, watch sugars).  Drink a lot of water.  Calcium recommendations are 1200-1500 mg daily (1500 mg for postmenopausal women or women without ovaries), and vitamin D 1000 IU daily.  This should be obtained from diet and/or supplements (vitamins), and calcium should not be taken all at once, but in divided doses.  Monthly self breast exams and yearly mammograms for women over the age of 32 is recommended.  Sunscreen of at least SPF 30 should be used on all sun-exposed parts of the skin when outside between the hours of 10 am and 4 pm (not just when at beach or pool, but even with exercise, golf, tennis, and yard work!)  Use a sunscreen that says "broad spectrum" so it covers both UVA and UVB rays, and make sure to reapply every 1-2 hours.  Remember to change the batteries in your smoke detectors when changing your clock times in the spring and fall. Carbon monoxide detectors are recommended for your home.  Use your seat belt every time you are in a car, and please drive safely and not be distracted with cell phones and texting while driving.  I recommend getting the updated bivalent COVID booster when available this Fall. We discussed (optional)  hepatitis A vaccination (if covered and desired).

## 2022-03-24 NOTE — Progress Notes (Unsigned)
No chief complaint on file.   Kendra Molina is a 41 y.o. female who presents for a complete physical.  She sees GYN yearly, last in 04/2021 (notes not received).   Her GYN switched her birth control to a progesterone-only (2021) due to higher blood pressures, and BP has been fine since.  She denies any spotting or side effects.  Immunization History  Administered Date(s) Administered   Influenza Split 06/05/2015   Influenza-Unspecified 06/18/2016, 05/24/2020   PFIZER(Purple Top)SARS-COV-2 Vaccination 10/05/2019, 10/27/2019, 06/11/2020   Tdap 09/22/2013   Gets flu shots yearly (required for work) Last Pap smear: 02/2017, through GYN, UTD.  Sees her annually Last mammogram: 09/2021 Last colonoscopy: never Last DEXA: never Dentist: twice yearly Ophtho: goes yearly Exercise:   Walks 2 miles in the mornings 3x/week. Walks dog when cooler, bike with kids. Uses bands, handweights, ab roller once a week  Lipids: Lab Results  Component Value Date   CHOL 194 03/12/2021   HDL 63 03/12/2021   LDLCALC 114 (H) 03/12/2021   TRIG 93 03/12/2021   CHOLHDL 3.1 03/12/2021   Vitamin D-OH 40.3 in 02/2020 Normal TSH, CBC and c-met in 02/2020  PMH, PSH, SH and FH were reviewed and updated    ROS:  The patient denies anorexia, fever, headaches,  vision changes, decreased hearing, ear pain, sore throat, breast concerns, chest pain, palpitations, dizziness, syncope, dyspnea on exertion, cough, swelling, nausea, vomiting, diarrhea, constipation, abdominal pain, melena, hematochezia, indigestion/heartburn, hematuria, incontinence, dysuria, irregular menstrual cycles, vaginal discharge, odor or itch, genital lesions, joint pains, numbness, tingling, weakness, tremor, suspicious skin lesions, depression, anxiety, abnormal bleeding/bruising, or enlarged lymph nodes. Sees dermatologist routinely.   PHYSICAL EXAM:  There were no vitals taken for this visit.  Wt Readings from Last 3 Encounters:  03/12/21  120 lb 3.2 oz (54.5 kg)  04/10/20 121 lb (54.9 kg)  03/06/20 117 lb 12.8 oz (53.4 kg)    General Appearance:    Alert, cooperative, no distress, appears stated age  Head:    Normocephalic, without obvious abnormality, atraumatic  Eyes:    PERRL, conjunctiva/corneas clear, EOM's intact, fundi    benign  Ears:    Normal TM's and external ear canals  Nose:   Normal, no drainage or sinus tenderness  Throat:   Normal mucosa, dentition, no lesions  Neck:   Supple, no lymphadenopathy;  thyroid:  no enlargement/ tenderness/nodules; no carotid bruit or JVD  Back:    Spine nontender, no curvature, ROM normal, no CVA   tenderness  Lungs:     Clear to auscultation bilaterally without wheezes, rales or ronchi; respirations unlabored  Chest Wall:    No tenderness or deformity   Heart:    Regular rate and rhythm, S1 and S2 normal, no murmur, rub or gallop  Breast Exam:    Deferred to GYN  Abdomen:     Soft, non-tender, nondistended, normoactive bowel sounds,    no masses, no hepatosplenomegaly  Genitalia:    Deferred to GYN       Extremities:   No clubbing, cyanosis or edema  Pulses:   2+ and symmetric all extremities  Skin:   Skin color, texture, turgor normal, no rashes or lesions noted (exam limited, not changed into gown).  Lymph nodes:   Cervical, supraclavicular nodes normal  Neurologic:   Normal strength, sensation and gait; reflexes 2+ and symmetric throughout  Psych:   Normal mood, affect, hygiene and grooming    ASSESSMENT/PLAN:  Add flu shot date, update if any COVID boosters received  ?labs needed  Discussed monthly self breast exams and yearly mammograms (had first earlier this year); at least 30 minutes of aerobic activity at least 5 days/week, weight-bearing exercise 2x/wk; proper sunscreen use reviewed; healthy diet, including goals of calcium and vitamin D intake and alcohol recommendations (less than or equal to 1 drink/day) reviewed; regular seatbelt  use; changing batteries in smoke detectors.  Immunization recommendations discussed--yearly flu shots are recommended. Colonoscopy recommendations reviewed-age 11, sooner if changes in family history or GI concerns develop  F/u 1 year, CPE

## 2022-03-25 ENCOUNTER — Encounter: Payer: Self-pay | Admitting: Family Medicine

## 2022-03-25 ENCOUNTER — Ambulatory Visit (INDEPENDENT_AMBULATORY_CARE_PROVIDER_SITE_OTHER): Payer: Federal, State, Local not specified - PPO | Admitting: Family Medicine

## 2022-03-25 VITALS — BP 120/82 | HR 72 | Ht 62.0 in | Wt 121.8 lb

## 2022-03-25 DIAGNOSIS — Z0000 Encounter for general adult medical examination without abnormal findings: Secondary | ICD-10-CM

## 2022-03-25 LAB — POCT URINALYSIS DIP (PROADVANTAGE DEVICE)
Bilirubin, UA: NEGATIVE
Blood, UA: NEGATIVE
Glucose, UA: NEGATIVE mg/dL
Ketones, POC UA: NEGATIVE mg/dL
Leukocytes, UA: NEGATIVE
Nitrite, UA: NEGATIVE
Protein Ur, POC: NEGATIVE mg/dL
Specific Gravity, Urine: 1.01
Urobilinogen, Ur: NEGATIVE
pH, UA: 7 (ref 5.0–8.0)

## 2022-04-29 ENCOUNTER — Encounter: Payer: Self-pay | Admitting: Internal Medicine

## 2022-05-08 DIAGNOSIS — Z01419 Encounter for gynecological examination (general) (routine) without abnormal findings: Secondary | ICD-10-CM | POA: Diagnosis not present

## 2022-05-08 DIAGNOSIS — Z6822 Body mass index (BMI) 22.0-22.9, adult: Secondary | ICD-10-CM | POA: Diagnosis not present

## 2022-06-02 ENCOUNTER — Encounter: Payer: Self-pay | Admitting: Internal Medicine

## 2022-10-08 DIAGNOSIS — Z1231 Encounter for screening mammogram for malignant neoplasm of breast: Secondary | ICD-10-CM | POA: Diagnosis not present

## 2022-10-08 LAB — HM MAMMOGRAPHY

## 2022-10-14 ENCOUNTER — Encounter: Payer: Self-pay | Admitting: *Deleted

## 2022-10-29 DIAGNOSIS — Z23 Encounter for immunization: Secondary | ICD-10-CM | POA: Diagnosis not present

## 2022-12-04 DIAGNOSIS — D2271 Melanocytic nevi of right lower limb, including hip: Secondary | ICD-10-CM | POA: Diagnosis not present

## 2022-12-04 DIAGNOSIS — L812 Freckles: Secondary | ICD-10-CM | POA: Diagnosis not present

## 2022-12-04 DIAGNOSIS — D225 Melanocytic nevi of trunk: Secondary | ICD-10-CM | POA: Diagnosis not present

## 2022-12-04 DIAGNOSIS — D2262 Melanocytic nevi of left upper limb, including shoulder: Secondary | ICD-10-CM | POA: Diagnosis not present

## 2023-04-13 NOTE — Patient Instructions (Signed)

## 2023-04-13 NOTE — Progress Notes (Unsigned)
No chief complaint on file.   Kendra Molina is a 42 y.o. female who presents for a complete physical.  She sees GYN yearly, last in 04/2022.  No notes were received.  Her GYN switched her birth control to a progesterone-only (2021) due to higher blood pressures, and BP has been fine since.  She has rare spotting (esp if a missed pill).  Immunization History  Administered Date(s) Administered   COVID-19, mRNA, vaccine(Comirnaty)12 years and older 10/29/2022   Influenza Split 05/26/2010, 06/05/2015   Influenza, Seasonal, Injecte, Preservative Fre 05/12/2013   Influenza-Unspecified 06/18/2016, 05/24/2020, 05/08/2021   PFIZER(Purple Top)SARS-COV-2 Vaccination 10/05/2019, 10/27/2019, 06/11/2020   Tdap 09/22/2013  Had Hep B series (for college), and titer for work [employer] flu shots yearly (required for work) Last Pap smear:  through GYN, UTD.  Sees her annually. Last 04/2022.  Last pap may have been 04/2020, normal. Last mammogram: 09/2022 Last colonoscopy: never Last DEXA: never Dentist: twice yearly Ophtho: goes yearly Exercise:   Walks 2 miles in the mornings 3x/week. Walks dog when cooler, bike with kids. Uses bands, handweights, ab roller once a week  Lipids: Lab Results  Component Value Date   CHOL 194 03/12/2021   HDL 63 03/12/2021   LDLCALC 114 (H) 03/12/2021   TRIG 93 03/12/2021   CHOLHDL 3.1 03/12/2021   Vitamin D-OH 40.3 in 02/2020 Normal TSH, CBC and c-met in 02/2020   PMH, PSH, SH and FH were reviewed and updated    ROS:  The patient denies anorexia, fever, headaches,  vision changes, decreased hearing, ear pain, sore throat, breast concerns, chest pain, palpitations, dizziness, syncope, dyspnea on exertion, cough, swelling, nausea, vomiting, diarrhea, constipation, abdominal pain, melena, hematochezia, indigestion/heartburn, hematuria, incontinence, dysuria, irregular menstrual cycles (rare spotting on progesterone-only pills), vaginal discharge, odor or itch, genital  lesions, joint pains, numbness, tingling, weakness, tremor, suspicious skin lesions, depression, anxiety, abnormal bleeding/bruising, or enlarged lymph nodes.  Sees dermatologist routinely. Seasonal allergies, mild now. Uses Allegra prn   PHYSICAL EXAM:  There were no vitals taken for this visit.  Wt Readings from Last 3 Encounters:  03/25/22 121 lb 12.8 oz (55.2 kg)  03/12/21 120 lb 3.2 oz (54.5 kg)  04/10/20 121 lb (54.9 kg)    General Appearance:    Alert, cooperative, no distress, appears stated age  Head:    Normocephalic, without obvious abnormality, atraumatic  Eyes:    PERRL, conjunctiva/corneas clear, EOM's intact, fundi    benign  Ears:    Normal TM's and external ear canals  Nose:   Normal, no drainage or sinus tenderness  Throat:   Normal mucosa, dentition, no lesions  Neck:   Supple, no lymphadenopathy;  thyroid:  no enlargement/ tenderness/nodules; no carotid bruit or JVD  Back:    Spine nontender, no curvature, ROM normal, no CVA tenderness  Lungs:     Clear to auscultation bilaterally without wheezes, rales or ronchi; respirations unlabored  Chest Wall:    No tenderness or deformity   Heart:    Regular rate and rhythm, S1 and S2 normal, no murmur, rub or gallop  Breast Exam:    Deferred to GYN  Abdomen:     Soft, non-tender, nondistended, normoactive bowel sounds, no masses, no hepatosplenomegaly  Genitalia:    Deferred to GYN       Extremities:   No clubbing, cyanosis or edema  Pulses:   2+ and symmetric all extremities  Skin:   Skin color, texture, turgor normal, no rashes or lesions noted  Lymph nodes:   Cervical, supraclavicular, and inguinal nodes normal  Neurologic:   Normal strength, sensation and gait; reflexes 2+ and symmetric throughout                           Psych:   Normal mood, affect, hygiene and grooming    ASSESSMENT/PLAN:   NEED RECORDS FROM 04/2022 GYN VISIT, AND PAP RESULTS (MAY BE FROM 04/2020, NOT SURE IF THEY DID ANOTHER PAP AT RECENT  VISIT; WE ONLY HAVE FROM 2018)   Add Vit D if not taking supplements (MVI)  Discussed monthly self breast exams and yearly mammograms (had first earlier this year); at least 30 minutes of aerobic activity at least 5 days/week, weight-bearing exercise 2x/wk; proper sunscreen use reviewed; healthy diet, including goals of calcium and vitamin D intake and alcohol recommendations (less than or equal to 1 drink/day) reviewed; regular seatbelt use; changing batteries in smoke detectors.  Immunization recommendations discussed--yearly flu shots are recommended. Updated COVID booster recommended when available in the Fall. TdaP Colonoscopy recommendations reviewed-age 42, sooner if changes in family history or GI concerns develop  F/u 1 year, CPE

## 2023-04-14 ENCOUNTER — Ambulatory Visit (INDEPENDENT_AMBULATORY_CARE_PROVIDER_SITE_OTHER): Payer: Federal, State, Local not specified - PPO | Admitting: Family Medicine

## 2023-04-14 ENCOUNTER — Encounter: Payer: Self-pay | Admitting: Family Medicine

## 2023-04-14 VITALS — BP 108/72 | HR 68 | Ht 62.25 in | Wt 126.2 lb

## 2023-04-14 DIAGNOSIS — Z0000 Encounter for general adult medical examination without abnormal findings: Secondary | ICD-10-CM | POA: Diagnosis not present

## 2023-04-14 DIAGNOSIS — N926 Irregular menstruation, unspecified: Secondary | ICD-10-CM

## 2023-04-14 DIAGNOSIS — Z23 Encounter for immunization: Secondary | ICD-10-CM | POA: Diagnosis not present

## 2023-04-14 DIAGNOSIS — R232 Flushing: Secondary | ICD-10-CM

## 2023-04-14 LAB — POCT URINALYSIS DIP (PROADVANTAGE DEVICE)
Bilirubin, UA: NEGATIVE
Blood, UA: NEGATIVE
Glucose, UA: NEGATIVE mg/dL
Ketones, POC UA: NEGATIVE mg/dL
Leukocytes, UA: NEGATIVE
Nitrite, UA: NEGATIVE
Protein Ur, POC: NEGATIVE mg/dL
Specific Gravity, Urine: 1.01
Urobilinogen, Ur: 0.2
pH, UA: 7 (ref 5.0–8.0)

## 2023-04-14 LAB — LIPID PANEL

## 2023-04-15 LAB — CMP14+EGFR
ALT: 21 IU/L (ref 0–32)
AST: 25 IU/L (ref 0–40)
Albumin: 4.5 g/dL (ref 3.9–4.9)
Alkaline Phosphatase: 55 IU/L (ref 44–121)
BUN/Creatinine Ratio: 14 (ref 9–23)
BUN: 11 mg/dL (ref 6–24)
Bilirubin Total: 0.4 mg/dL (ref 0.0–1.2)
CO2: 23 mmol/L (ref 20–29)
Calcium: 9.6 mg/dL (ref 8.7–10.2)
Chloride: 102 mmol/L (ref 96–106)
Creatinine, Ser: 0.77 mg/dL (ref 0.57–1.00)
Globulin, Total: 2.7 g/dL (ref 1.5–4.5)
Glucose: 87 mg/dL (ref 70–99)
Potassium: 4.8 mmol/L (ref 3.5–5.2)
Sodium: 139 mmol/L (ref 134–144)
Total Protein: 7.2 g/dL (ref 6.0–8.5)
eGFR: 99 mL/min/{1.73_m2} (ref >59)

## 2023-04-15 LAB — CBC WITH DIFFERENTIAL/PLATELET
Basophils Absolute: 0 10*3/uL (ref 0.0–0.2)
Basos: 0 %
EOS (ABSOLUTE): 0.1 10*3/uL (ref 0.0–0.4)
Eos: 1 %
Hematocrit: 40.6 % (ref 34.0–46.6)
Hemoglobin: 13.1 g/dL (ref 11.1–15.9)
Immature Grans (Abs): 0 10*3/uL (ref 0.0–0.1)
Immature Granulocytes: 0 %
Lymphocytes Absolute: 2.1 10*3/uL (ref 0.7–3.1)
Lymphs: 29 %
MCH: 31.9 pg (ref 26.6–33.0)
MCHC: 32.3 g/dL (ref 31.5–35.7)
MCV: 99 fL — ABNORMAL HIGH (ref 79–97)
Monocytes Absolute: 0.7 10*3/uL (ref 0.1–0.9)
Monocytes: 9 %
Neutrophils Absolute: 4.3 10*3/uL (ref 1.4–7.0)
Neutrophils: 61 %
Platelets: 217 10*3/uL (ref 150–450)
RBC: 4.11 x10E6/uL (ref 3.77–5.28)
RDW: 12.5 % (ref 11.7–15.4)
WBC: 7.2 10*3/uL (ref 3.4–10.8)

## 2023-04-15 LAB — LIPID PANEL
Chol/HDL Ratio: 2.6 ratio (ref 0.0–4.4)
Cholesterol, Total: 178 mg/dL (ref 100–199)
HDL: 68 mg/dL (ref >39)
LDL Chol Calc (NIH): 94 mg/dL (ref 0–99)
Triglycerides: 89 mg/dL (ref 0–149)
VLDL Cholesterol Cal: 16 mg/dL (ref 5–40)

## 2023-04-15 LAB — FOLLICLE STIMULATING HORMONE: FSH: 4.4 m[IU]/mL

## 2023-04-15 LAB — TSH: TSH: 1.88 u[IU]/mL (ref 0.450–4.500)

## 2023-04-21 ENCOUNTER — Encounter: Payer: Self-pay | Admitting: *Deleted

## 2023-06-01 ENCOUNTER — Encounter: Payer: Self-pay | Admitting: Family Medicine

## 2023-06-01 DIAGNOSIS — Z01419 Encounter for gynecological examination (general) (routine) without abnormal findings: Secondary | ICD-10-CM | POA: Diagnosis not present

## 2023-06-01 DIAGNOSIS — Z124 Encounter for screening for malignant neoplasm of cervix: Secondary | ICD-10-CM | POA: Diagnosis not present

## 2023-06-01 DIAGNOSIS — Z1331 Encounter for screening for depression: Secondary | ICD-10-CM | POA: Diagnosis not present

## 2023-06-01 NOTE — Telephone Encounter (Signed)
Patient advised.

## 2023-06-09 DIAGNOSIS — N920 Excessive and frequent menstruation with regular cycle: Secondary | ICD-10-CM | POA: Diagnosis not present

## 2023-06-16 DIAGNOSIS — L538 Other specified erythematous conditions: Secondary | ICD-10-CM | POA: Diagnosis not present

## 2023-06-16 DIAGNOSIS — L910 Hypertrophic scar: Secondary | ICD-10-CM | POA: Diagnosis not present

## 2023-10-11 DIAGNOSIS — Z1231 Encounter for screening mammogram for malignant neoplasm of breast: Secondary | ICD-10-CM | POA: Diagnosis not present

## 2023-10-11 LAB — HM MAMMOGRAPHY

## 2023-10-12 ENCOUNTER — Encounter: Payer: Self-pay | Admitting: Family Medicine

## 2023-12-15 DIAGNOSIS — L739 Follicular disorder, unspecified: Secondary | ICD-10-CM | POA: Diagnosis not present

## 2023-12-15 DIAGNOSIS — L821 Other seborrheic keratosis: Secondary | ICD-10-CM | POA: Diagnosis not present

## 2023-12-15 DIAGNOSIS — L814 Other melanin hyperpigmentation: Secondary | ICD-10-CM | POA: Diagnosis not present

## 2023-12-15 DIAGNOSIS — L910 Hypertrophic scar: Secondary | ICD-10-CM | POA: Diagnosis not present

## 2024-01-24 DIAGNOSIS — L739 Follicular disorder, unspecified: Secondary | ICD-10-CM | POA: Diagnosis not present

## 2024-01-24 DIAGNOSIS — L218 Other seborrheic dermatitis: Secondary | ICD-10-CM | POA: Diagnosis not present

## 2024-03-27 DIAGNOSIS — L739 Follicular disorder, unspecified: Secondary | ICD-10-CM | POA: Diagnosis not present

## 2024-04-19 ENCOUNTER — Encounter: Payer: Federal, State, Local not specified - PPO | Admitting: Family Medicine

## 2024-05-01 ENCOUNTER — Encounter: Admitting: Family Medicine

## 2024-05-10 NOTE — Progress Notes (Unsigned)
 No chief complaint on file.   Kendra Molina is a 43 y.o. female who presents for a complete physical.  She sees GYN yearly, last in 04/2022.  No notes were received.  Her GYN switched her birth control to a progesterone-only (2021) due to higher blood pressures, and BP has been fine since.  Last year she reported having more spotting over the prior year, starting prior to placebo pills, without any missed pills. She had also reported having hot flashes daily, no night sweats, and some trouble losing some weight she had gained.  She saw GYN in 04/2023  ***UPDATE--ANY CHANGES??  She sent us  a message about getting an order for a calcium score, as she had discussed this with her GYN (but didn't get an order). ***UPDATE  Lab Results  Component Value Date   CHOL 178 04/14/2023   HDL 68 04/14/2023   LDLCALC 94 04/14/2023   TRIG 89 04/14/2023   CHOLHDL 2.6 04/14/2023   The ASCVD Risk score (Arnett DK, et al., 2019) failed to calculate for the following reasons:   The systolic blood pressure is missing    Immunization History  Administered Date(s) Administered   Influenza Split 05/26/2010, 06/05/2015   Influenza, Seasonal, Injecte, Preservative Fre 05/12/2013   Influenza-Unspecified 06/18/2016, 05/24/2020, 05/08/2021   PFIZER(Purple Top)SARS-COV-2 Vaccination 10/05/2019, 10/27/2019, 06/11/2020   Pfizer(Comirnaty)Fall Seasonal Vaccine 12 years and older 10/29/2022   Tdap 09/22/2013, 04/14/2023  Had Hep B series (for college), and titer for work [employer] flu shots yearly (required for work) Last Pap smear:  04/2020 through GYN, UTD.  Sees her annually. Last 04/2023.   Last mammogram: 09/2023 Last colonoscopy: never Last DEXA: never Dentist: twice yearly Ophtho: goes yearly Exercise:    Walks 2 miles in the mornings 3x/week. Walks dog when cooler, bike with kids. Uses bands, handweights, ab roller once a week. Mows with self-propelled mower.  Lipids: Lab Results  Component Value Date    CHOL 178 04/14/2023   HDL 68 04/14/2023   LDLCALC 94 04/14/2023   TRIG 89 04/14/2023   CHOLHDL 2.6 04/14/2023   Vitamin D -OH 40.3 in 02/2020 Normal TSH, CBC and c-met in 02/2020   PMH, PSH, SH and FH were reviewed and updated    ROS:  The patient denies anorexia, fever, headaches,  vision changes, decreased hearing, ear pain, sore throat, breast concerns, chest pain, palpitations, dizziness, syncope, dyspnea on exertion, cough, swelling, nausea, vomiting, diarrhea, constipation, abdominal pain, melena, hematochezia, indigestion/heartburn, hematuria, incontinence, dysuria, vaginal discharge, odor or itch, genital lesions, joint pains, numbness, tingling, weakness, tremor, suspicious skin lesions, depression, anxiety, abnormal bleeding/bruising, or enlarged lymph nodes.  Sees dermatologist routinely. Seasonal allergies, controlled by zyrtec. Irregular vaginal bleeding and hot flashes per HPI. Weight    PHYSICAL EXAM:  There were no vitals taken for this visit.  Wt Readings from Last 3 Encounters:  04/14/23 126 lb 3.2 oz (57.2 kg)  03/25/22 121 lb 12.8 oz (55.2 kg)  03/12/21 120 lb 3.2 oz (54.5 kg)    General Appearance:    Alert, cooperative, no distress, appears stated age  Head:    Normocephalic, without obvious abnormality, atraumatic  Eyes:    PERRL, conjunctiva/corneas clear, EOM's intact, fundi    benign  Ears:    Normal TM's and external ear canals  Nose:   Normal, no drainage or sinus tenderness  Throat:   Normal mucosa, dentition, no lesions  Neck:   Supple, no lymphadenopathy;  thyroid :  no enlargement/ tenderness/nodules; no carotid bruit or  JVD  Back:    Spine nontender, no curvature, ROM normal, no CVA tenderness  Lungs:     Clear to auscultation bilaterally without wheezes, rales or ronchi; respirations unlabored  Chest Wall:    No tenderness or deformity   Heart:    Regular rate and rhythm, S1 and S2 normal, no murmur, rub or gallop  Breast Exam:    Deferred  to GYN  Abdomen:     Soft, non-tender, nondistended, normoactive bowel sounds, no masses, no hepatosplenomegaly  Genitalia:    Deferred to GYN       Extremities:   No clubbing, cyanosis or edema  Pulses:   2+ and symmetric all extremities  Skin:   Skin color, texture, turgor normal, no rashes or lesions noted  Lymph nodes:   Cervical and supraclavicular,  normal  Neurologic:   Normal strength, sensation and gait; reflexes 2+ and symmetric throughout                           Psych:   Normal mood, affect, hygiene and grooming    ASSESSMENT/PLAN:     Discussed monthly self breast exams and yearly mammograms, at least 30 minutes of aerobic activity at least 5 days/week, weight-bearing exercise 2x/wk; proper sunscreen use reviewed; healthy diet, including goals of calcium and vitamin D  intake and alcohol recommendations (less than or equal to 1 drink/day) reviewed; regular seatbelt use; changing batteries in smoke detectors.  Immunization recommendations discussed--continue yearly flu shots.  Discussed COVID booster. Colonoscopy recommendations reviewed-age 89, sooner if changes in family history or GI concerns develop  F/u 1 year, CPE

## 2024-05-10 NOTE — Patient Instructions (Signed)

## 2024-05-11 ENCOUNTER — Ambulatory Visit (INDEPENDENT_AMBULATORY_CARE_PROVIDER_SITE_OTHER): Admitting: Family Medicine

## 2024-05-11 ENCOUNTER — Encounter: Payer: Self-pay | Admitting: Family Medicine

## 2024-05-11 VITALS — BP 100/60 | HR 68 | Ht 62.0 in | Wt 113.2 lb

## 2024-05-11 DIAGNOSIS — Z8249 Family history of ischemic heart disease and other diseases of the circulatory system: Secondary | ICD-10-CM | POA: Diagnosis not present

## 2024-05-11 DIAGNOSIS — Z23 Encounter for immunization: Secondary | ICD-10-CM

## 2024-05-11 DIAGNOSIS — N926 Irregular menstruation, unspecified: Secondary | ICD-10-CM

## 2024-05-11 DIAGNOSIS — R232 Flushing: Secondary | ICD-10-CM

## 2024-05-11 DIAGNOSIS — Z0000 Encounter for general adult medical examination without abnormal findings: Secondary | ICD-10-CM

## 2024-05-11 LAB — LIPID PANEL

## 2024-05-12 ENCOUNTER — Encounter: Payer: Self-pay | Admitting: Family Medicine

## 2024-05-12 ENCOUNTER — Ambulatory Visit: Payer: Self-pay | Admitting: Family Medicine

## 2024-05-12 DIAGNOSIS — R748 Abnormal levels of other serum enzymes: Secondary | ICD-10-CM

## 2024-05-12 LAB — COMPREHENSIVE METABOLIC PANEL WITH GFR
ALT: 51 IU/L — AB (ref 0–32)
AST: 40 IU/L (ref 0–40)
Albumin: 4.6 g/dL (ref 3.9–4.9)
Alkaline Phosphatase: 51 IU/L (ref 41–116)
BUN/Creatinine Ratio: 11 (ref 9–23)
BUN: 8 mg/dL (ref 6–24)
Bilirubin Total: 0.5 mg/dL (ref 0.0–1.2)
CO2: 22 mmol/L (ref 20–29)
Calcium: 9.5 mg/dL (ref 8.7–10.2)
Chloride: 101 mmol/L (ref 96–106)
Creatinine, Ser: 0.73 mg/dL (ref 0.57–1.00)
Globulin, Total: 2.5 g/dL (ref 1.5–4.5)
Glucose: 85 mg/dL (ref 70–99)
Potassium: 4.3 mmol/L (ref 3.5–5.2)
Sodium: 138 mmol/L (ref 134–144)
Total Protein: 7.1 g/dL (ref 6.0–8.5)
eGFR: 105 mL/min/1.73 (ref >59)

## 2024-05-12 LAB — CBC WITH DIFFERENTIAL/PLATELET
Basophils Absolute: 0 x10E3/uL (ref 0.0–0.2)
Basos: 0 %
EOS (ABSOLUTE): 0.1 x10E3/uL (ref 0.0–0.4)
Eos: 1 %
Hematocrit: 39.1 % (ref 34.0–46.6)
Hemoglobin: 12.6 g/dL (ref 11.1–15.9)
Immature Grans (Abs): 0 x10E3/uL (ref 0.0–0.1)
Immature Granulocytes: 0 %
Lymphocytes Absolute: 1.9 x10E3/uL (ref 0.7–3.1)
Lymphs: 23 %
MCH: 31 pg (ref 26.6–33.0)
MCHC: 32.2 g/dL (ref 31.5–35.7)
MCV: 96 fL (ref 79–97)
Monocytes Absolute: 0.7 x10E3/uL (ref 0.1–0.9)
Monocytes: 9 %
Neutrophils Absolute: 5.7 x10E3/uL (ref 1.4–7.0)
Neutrophils: 67 %
Platelets: 208 x10E3/uL (ref 150–450)
RBC: 4.06 x10E6/uL (ref 3.77–5.28)
RDW: 12.3 % (ref 11.7–15.4)
WBC: 8.4 x10E3/uL (ref 3.4–10.8)

## 2024-05-12 LAB — LIPID PANEL
Cholesterol, Total: 167 mg/dL (ref 100–199)
HDL: 70 mg/dL (ref >39)
LDL CALC COMMENT:: 2.4 ratio (ref 0.0–4.4)
LDL Chol Calc (NIH): 82 mg/dL (ref 0–99)
Triglycerides: 79 mg/dL (ref 0–149)
VLDL Cholesterol Cal: 15 mg/dL (ref 5–40)

## 2024-06-13 ENCOUNTER — Other Ambulatory Visit: Payer: Self-pay | Admitting: Obstetrics

## 2024-06-13 DIAGNOSIS — Z8249 Family history of ischemic heart disease and other diseases of the circulatory system: Secondary | ICD-10-CM

## 2024-06-13 DIAGNOSIS — Z1331 Encounter for screening for depression: Secondary | ICD-10-CM | POA: Diagnosis not present

## 2024-06-13 DIAGNOSIS — Z01419 Encounter for gynecological examination (general) (routine) without abnormal findings: Secondary | ICD-10-CM | POA: Diagnosis not present

## 2024-07-11 ENCOUNTER — Other Ambulatory Visit: Payer: Self-pay

## 2024-07-11 DIAGNOSIS — R748 Abnormal levels of other serum enzymes: Secondary | ICD-10-CM | POA: Diagnosis not present

## 2024-07-12 ENCOUNTER — Ambulatory Visit: Payer: Self-pay | Admitting: Family Medicine

## 2024-07-12 LAB — HEPATIC FUNCTION PANEL
ALT: 25 IU/L (ref 0–32)
AST: 26 IU/L (ref 0–40)
Albumin: 4.6 g/dL (ref 3.9–4.9)
Alkaline Phosphatase: 50 IU/L (ref 41–116)
Bilirubin Total: 0.5 mg/dL (ref 0.0–1.2)
Bilirubin, Direct: 0.17 mg/dL (ref 0.00–0.40)
Total Protein: 7.2 g/dL (ref 6.0–8.5)

## 2024-07-26 DIAGNOSIS — R040 Epistaxis: Secondary | ICD-10-CM | POA: Diagnosis not present

## 2024-08-31 ENCOUNTER — Ambulatory Visit
Admission: RE | Admit: 2024-08-31 | Discharge: 2024-08-31 | Disposition: A | Discharge status: Home / Self Care | Source: Ambulatory Visit | Attending: Obstetrics | Admitting: Obstetrics

## 2024-08-31 DIAGNOSIS — Z8249 Family history of ischemic heart disease and other diseases of the circulatory system: Secondary | ICD-10-CM

## 2024-09-01 ENCOUNTER — Encounter: Payer: Self-pay | Admitting: Family Medicine

## 2024-09-03 NOTE — Progress Notes (Unsigned)
 Start time: End time:  Virtual Visit via Video Note  I connected with Kendra Molina on 09/03/2024 by a video enabled telemedicine application and verified that I am speaking with the correct person using two identifiers.  Location: Patient: *** Provider: office   I discussed the limitations of evaluation and management by telemedicine and the availability of in person appointments. The patient expressed understanding and agreed to proceed.  History of Present Illness:  No chief complaint on file.  Patient presents to discuss her recent calcium  score. Scan done 08/31/24: IMPRESSION: Punctate atherosclerotic calcification about the proximal left anterior descending coronary artery (calcium  score of 35.6).  No incidental findings were noted.  There is family history of premature CAD, her father is s/p CABG at age 38 Her lipids have been good with dietary measures.  The 10-year ASCVD risk score (Arnett DK, et al., 2019) is: 0.3%  Component Ref Range & Units (hover) 3 mo ago 1 yr ago 3 yr ago 4 yr ago 10 yr ago  Cholesterol, Total 167 178 194 222 High    Triglycerides 79 89 93 154 High  57 R  HDL 70 68 63 74 67  VLDL Cholesterol Cal 15 16 17 27    LDL Chol Calc (NIH) 82 94 885 High  121 High    Chol/HDL Ratio 2.4 2.6 CM 3.1 CM 3.0 CM 2.4 R     PMH, PSH, SH and FH were reviewed. On OCP's for contraception    ROS:     Observations/Objective:  There were no vitals taken for this visit. Wt Readings from Last 3 Encounters:  05/11/24 113 lb 3.2 oz (51.3 kg)  04/14/23 126 lb 3.2 oz (57.2 kg)  03/25/22 121 lb 12.8 oz (55.2 kg)     Assessment and Plan:  Low dose rosuvastatin >   Follow Up Instructions:    I discussed the assessment and treatment plan with the patient. The patient was provided an opportunity to ask questions and all were answered. The patient agreed with the plan and demonstrated an understanding of the instructions.   The patient was advised to  call back or seek an in-person evaluation if the symptoms worsen or if the condition fails to improve as anticipated.  I spent *** minutes dedicated to the care of this patient, including pre-visit review of records, face to face time, post-visit ordering of testing and documentation.    Kendra DELENA Fetters, MD

## 2024-09-04 ENCOUNTER — Telehealth: Admitting: Family Medicine

## 2024-09-04 ENCOUNTER — Encounter: Payer: Self-pay | Admitting: Family Medicine

## 2024-09-04 VITALS — Ht 62.0 in | Wt 116.0 lb

## 2024-09-04 DIAGNOSIS — R931 Abnormal findings on diagnostic imaging of heart and coronary circulation: Secondary | ICD-10-CM

## 2024-09-04 DIAGNOSIS — Z5181 Encounter for therapeutic drug level monitoring: Secondary | ICD-10-CM | POA: Diagnosis not present

## 2024-09-04 DIAGNOSIS — Z8249 Family history of ischemic heart disease and other diseases of the circulatory system: Secondary | ICD-10-CM | POA: Diagnosis not present

## 2024-09-04 MED ORDER — ROSUVASTATIN CALCIUM 5 MG PO TABS: 5.0000 mg | ORAL_TABLET | Freq: Every day | ORAL | 2 refills | Status: AC

## 2024-09-04 NOTE — Progress Notes (Signed)
Fasting labs scheduled.  

## 2024-09-04 NOTE — Patient Instructions (Addendum)
 We discussed starting low dose rosuvastatin . Start 5 mg daily. If you get muscular side effects, add in the supplement CoEnzyme Q10, and take that daily. If you still have side effects, you can stop the statin for a week, and restart more slowly (once a week and gradually increasing the dose to the maximally tolerated dose, whether that is once or 3-4x/week if not daily).  We will recheck your cholesterol, liver tests, as well as some other tests evaluate your cardiovascular risk in 2-3 months (prior to running out of your medication).  If doing well, I will change to 90 day prescriptions for the year.

## 2024-09-09 ENCOUNTER — Other Ambulatory Visit: Payer: Self-pay | Admitting: Family Medicine

## 2024-09-09 ENCOUNTER — Encounter: Payer: Self-pay | Admitting: Family Medicine

## 2024-09-09 MED ORDER — OSELTAMIVIR PHOSPHATE 75 MG PO CAPS: 75.0000 mg | ORAL_CAPSULE | Freq: Every day | ORAL | 0 refills | Status: AC

## 2024-09-09 NOTE — Progress Notes (Signed)
 Her daughter has the flu and she would like Tamiflu  called in

## 2024-11-14 ENCOUNTER — Other Ambulatory Visit

## 2025-06-13 ENCOUNTER — Encounter: Payer: Self-pay | Admitting: Family Medicine
